# Patient Record
Sex: Female | Born: 1935 | Race: White | Hispanic: No | Smoking: Never smoker
Health system: Southern US, Community
[De-identification: ages and names within clinical notes are randomized; demographics above are authoritative.]

## PROBLEM LIST (undated history)

## (undated) DIAGNOSIS — I872 Venous insufficiency (chronic) (peripheral): Secondary | ICD-10-CM

## (undated) DIAGNOSIS — M810 Age-related osteoporosis without current pathological fracture: Secondary | ICD-10-CM

## (undated) DIAGNOSIS — I10 Essential (primary) hypertension: Secondary | ICD-10-CM

## (undated) DIAGNOSIS — I4891 Unspecified atrial fibrillation: Secondary | ICD-10-CM

## (undated) DIAGNOSIS — Z853 Personal history of malignant neoplasm of breast: Secondary | ICD-10-CM

## (undated) HISTORY — DX: Age-related osteoporosis without current pathological fracture: M81.0

## (undated) HISTORY — DX: Venous insufficiency (chronic) (peripheral): I87.2

## (undated) HISTORY — DX: Personal history of malignant neoplasm of breast: Z85.3

## (undated) HISTORY — PX: CATARACT EXTRACTION: SUR2

## (undated) HISTORY — DX: Essential (primary) hypertension: I10

## (undated) HISTORY — DX: Unspecified atrial fibrillation: I48.91

---

## 1980-08-24 HISTORY — PX: BREAST BIOPSY: SHX20

## 1992-08-24 HISTORY — PX: UMBILICAL HERNIA REPAIR: SHX196

## 1996-08-24 HISTORY — PX: BREAST BIOPSY: SHX20

## 2006-08-19 ENCOUNTER — Other Ambulatory Visit: Payer: Self-pay

## 2007-08-25 HISTORY — PX: COLONOSCOPY: SHX174

## 2008-08-24 DIAGNOSIS — Z853 Personal history of malignant neoplasm of breast: Secondary | ICD-10-CM

## 2008-08-24 HISTORY — DX: Personal history of malignant neoplasm of breast: Z85.3

## 2009-09-24 ENCOUNTER — Other Ambulatory Visit: Payer: Self-pay | Admitting: Internal Medicine

## 2009-09-24 ENCOUNTER — Encounter: Payer: Self-pay | Admitting: Cardiovascular Disease

## 2009-09-24 HISTORY — PX: BREAST BIOPSY: SHX20

## 2009-09-26 ENCOUNTER — Ambulatory Visit: Payer: Self-pay | Admitting: Cardiovascular Disease

## 2009-09-26 DIAGNOSIS — R600 Localized edema: Secondary | ICD-10-CM | POA: Insufficient documentation

## 2009-09-26 DIAGNOSIS — I10 Essential (primary) hypertension: Secondary | ICD-10-CM

## 2009-09-26 DIAGNOSIS — I4891 Unspecified atrial fibrillation: Secondary | ICD-10-CM | POA: Insufficient documentation

## 2009-09-30 ENCOUNTER — Telehealth: Payer: Self-pay | Admitting: Cardiovascular Disease

## 2009-10-09 ENCOUNTER — Telehealth: Payer: Self-pay | Admitting: Cardiovascular Disease

## 2009-10-18 ENCOUNTER — Encounter: Payer: Self-pay | Admitting: Cardiovascular Disease

## 2009-11-01 ENCOUNTER — Encounter: Payer: Self-pay | Admitting: Cardiovascular Disease

## 2009-11-05 ENCOUNTER — Telehealth: Payer: Self-pay | Admitting: Cardiovascular Disease

## 2009-11-06 ENCOUNTER — Encounter: Payer: Self-pay | Admitting: Cardiology

## 2009-11-06 LAB — CONVERTED CEMR LAB: INR: 2.5

## 2009-11-26 ENCOUNTER — Encounter: Payer: Self-pay | Admitting: Cardiovascular Disease

## 2009-12-06 ENCOUNTER — Telehealth: Payer: Self-pay | Admitting: Cardiovascular Disease

## 2009-12-24 ENCOUNTER — Encounter: Payer: Self-pay | Admitting: Cardiovascular Disease

## 2009-12-25 ENCOUNTER — Encounter: Payer: Self-pay | Admitting: Cardiovascular Disease

## 2010-01-06 ENCOUNTER — Encounter: Payer: Self-pay | Admitting: Cardiovascular Disease

## 2010-01-06 ENCOUNTER — Ambulatory Visit: Payer: Self-pay | Admitting: Cardiovascular Disease

## 2010-01-21 ENCOUNTER — Encounter: Payer: Self-pay | Admitting: Cardiovascular Disease

## 2010-01-22 ENCOUNTER — Encounter: Payer: Self-pay | Admitting: Cardiovascular Disease

## 2010-02-03 ENCOUNTER — Encounter: Payer: Self-pay | Admitting: Cardiovascular Disease

## 2010-02-04 ENCOUNTER — Encounter: Payer: Self-pay | Admitting: Cardiovascular Disease

## 2010-02-13 ENCOUNTER — Encounter: Payer: Self-pay | Admitting: Cardiovascular Disease

## 2010-02-17 ENCOUNTER — Encounter: Payer: Self-pay | Admitting: Cardiovascular Disease

## 2010-03-03 ENCOUNTER — Encounter: Payer: Self-pay | Admitting: Cardiovascular Disease

## 2010-03-04 ENCOUNTER — Encounter: Payer: Self-pay | Admitting: Cardiovascular Disease

## 2010-03-18 ENCOUNTER — Encounter: Payer: Self-pay | Admitting: Cardiovascular Disease

## 2010-03-19 ENCOUNTER — Encounter: Payer: Self-pay | Admitting: Cardiovascular Disease

## 2010-04-09 ENCOUNTER — Telehealth: Payer: Self-pay | Admitting: Cardiovascular Disease

## 2010-04-15 ENCOUNTER — Encounter: Payer: Self-pay | Admitting: Cardiovascular Disease

## 2010-04-15 LAB — CONVERTED CEMR LAB: Prothrombin Time: 18.1 s

## 2010-04-17 ENCOUNTER — Encounter: Payer: Self-pay | Admitting: Cardiovascular Disease

## 2010-05-06 ENCOUNTER — Encounter: Payer: Self-pay | Admitting: Cardiovascular Disease

## 2010-05-12 ENCOUNTER — Telehealth: Payer: Self-pay | Admitting: Cardiovascular Disease

## 2010-05-12 ENCOUNTER — Encounter: Payer: Self-pay | Admitting: Cardiovascular Disease

## 2010-05-21 ENCOUNTER — Telehealth: Payer: Self-pay | Admitting: Cardiovascular Disease

## 2010-06-16 ENCOUNTER — Telehealth: Payer: Self-pay | Admitting: Cardiovascular Disease

## 2010-07-11 ENCOUNTER — Ambulatory Visit: Payer: Self-pay | Admitting: Cardiovascular Disease

## 2010-08-13 ENCOUNTER — Encounter: Payer: Self-pay | Admitting: Cardiovascular Disease

## 2010-09-23 NOTE — Progress Notes (Signed)
Summary: PHI  PHI   Imported By: Harlon Flor 09/27/2009 12:25:52  _____________________________________________________________________  External Attachment:    Type:   Image     Comment:   External Document

## 2010-09-23 NOTE — Progress Notes (Signed)
Summary: RX  Phone Note Refill Request Call back at Home Phone [phone] Message from:  Patient on June 16, 2010 11:11 AM  Refills Requested: Medication #1:  METOPROLOL TARTRATE 50 MG TABS Take 1 and 1/2 tablets by mouth twice a day   Notes: WOULD LIKE A 90 DAY SUPPLY [facility]  Initial call taken by: Harlon Flor,  June 16, 2010 11:11 AM    Prescriptions: METOPROLOL TARTRATE 50 MG TABS (METOPROLOL TARTRATE) Take 1 and 1/2 tablets by mouth twice a day  #135 x 3   Entered by:   Bishop Dublin, CMA   Authorized by:   Dossie Arbour MD   Signed by:   Bishop Dublin, CMA on 06/16/2010   Method used:   Faxed to ...       [facility] (mail-order)             , [place]         Ph: [phone]       Fax: [phone]   RxID:   [id number]

## 2010-09-23 NOTE — Assessment & Plan Note (Signed)
Summary: F6M/AMD   Visit Type:  Follow-up Primary Provider:  Dr. Burnett Sheng  CC:  c/o edema in right leg and ankles..  History of Present Illness: [name] is a very pleasant 77 year old woman with a history of chronic atrial fibrillation, on Coumadin with rate control, negative stress test in August 2009 with no known coronary artery disease who presents for routine followup,   H/o right mastectomy for breast cancer. Following the surgery, she had atrial fibrillation with RVR. She was given a dose of Cardizem and her rate improved.  she continues to have trace lower extremity edema though it has been relatively stable. She denies any shortness of breath. She has been active, she continues to drive. She has been getting her condo that he at [address] [place] to accommodate her husband who is recovering from a stroke.  her weight has been slowly trending downward. She eats a significant amount of despite this she is had difficulty maintaining weight. She also has arthritis that bothers her particularly in the morning.  EKG shows atrial fibrillation with ventricular rate 86 beats per minute, no significant ST or T wave changes  Current Medications (verified): 1)  Hydrochlorothiazide 25 Mg Tabs (Hydrochlorothiazide) .Marland Kitchen.. 1 Tab By Mouth Every Morning 2)  Coumadin 2.5 Mg Tabs (Warfarin Sodium) .... As Directed 3)  Aspirin 81 Mg Tbec (Aspirin) .... Take One Tablet By Mouth Daily 4)  Metoprolol Tartrate 50 Mg Tabs (Metoprolol Tartrate) .... Take 1 and 1/2 Tablets By Mouth Twice A Day 5)  Warfarin Sodium 3 Mg Tabs (Warfarin Sodium) .... Take As Directed 6)  Femara 2.5 Mg Tabs (Letrozole) .... Take 1 Tablet By Mouth Once A Day 7)  Furosemide 20 Mg Tabs (Furosemide) .... One Tablet Once Daily 8)  Meloxicam 15 Mg Tabs (Meloxicam) .... One Tablet Once Daily For 4 Weeks.  Allergies (verified): No Known Drug Allergies  Past History:  Past Medical History: Last updated: 25-Oct-2009 Atrial  Fibrillation  Past Surgical History: Last updated: 10-25-2009 Breast surgery on right side 09/24/09 Cataract surgery 2008 and 2009  Family History: Last updated: 10-25-09 Father: Died at 4 years old of cerebral hemarrhage. Mother: Died at 75 years old of old age.  Social History: Last updated: October 25, 2009 Retired  Married  Tobacco Use - No.  Alcohol Use - yes Regular Exercise - yes Drug Use - no  Risk Factors: Alcohol Use: 1 (10-25-09) Caffeine Use: 1 cup (10-25-2009) Exercise: yes (October 25, 2009)  Risk Factors: Smoking Status: never (10/25/09)  Review of Systems       The patient complains of peripheral edema and difficulty walking.  The patient denies fever, weight loss, weight gain, vision loss, decreased hearing, hoarseness, chest pain, syncope, dyspnea on exertion, prolonged cough, abdominal pain, incontinence, muscle weakness, depression, and enlarged lymph nodes.    Vital Signs:  Patient profile:   77 year old female Height:      68 inches Weight:      122.25 pounds BMI:     18.66 Pulse rate:   86 / minute BP sitting:   150 / 90  (left arm) Cuff size:   small  Vitals Entered By: Bishop Dublin, CMA (July 11, 2010 11:13 AM)  Physical Exam  General:  elderly woman in no apparent distress, HEENT exam is benign, or Chalmers Guest is clear, neck is supple with no JVP or carotid bruits, heart sounds are irregular with S1-S2 no murmurs appreciated, lungs are clear to auscultation with no wheezes rales, abdominal exam is benign, she has trace  lower extremity edema around her ankles bilaterally, signs of chronic venous stasis skin changes up the shins, pulses are equal and symmetrical in her upper and lower extremities.   Impression & Recommendations:  Problem # 1:  ATRIAL FIBRILLATION (ICD-427.31) rate is stable on her current medication regimen. We've made no changes..  Her updated medication list for this problem includes:    Coumadin 2.5 Mg Tabs (Warfarin sodium)  .Marland Kitchen... As directed    Aspirin 81 Mg Tbec (Aspirin) .Marland Kitchen... Take one tablet by mouth daily    Metoprolol Tartrate 50 Mg Tabs (Metoprolol tartrate) .Marland Kitchen... Take 1 and 1/2 tablets by mouth twice a day    Warfarin Sodium 3 Mg Tabs (Warfarin sodium) .Marland Kitchen... Take as directed  Orders: EKG w/ Interpretation (93000)  Problem # 2:  HYPERTENSION, BENIGN (ICD-401.1) Blood pressure is borderline elevated today though she reports is well controlled at home. We've made no medication changes and we'll continue to monitor her blood pressure.  Her updated medication list for this problem includes:    Hydrochlorothiazide 25 Mg Tabs (Hydrochlorothiazide) .Marland Kitchen... 1 tab by mouth every morning    Aspirin 81 Mg Tbec (Aspirin) .Marland Kitchen... Take one tablet by mouth daily    Metoprolol Tartrate 50 Mg Tabs (Metoprolol tartrate) .Marland Kitchen... Take 1 and 1/2 tablets by mouth twice a day    Furosemide 20 Mg Tabs (Furosemide) ..... One tablet once daily  Problem # 3:  EDEMA (ICD-782.3) Her edema is likely secondary to chronic venous stasis. I've encouraged TED hose and leg elevation.  Patient Instructions: 1)  Your physician recommends that you schedule a follow-up appointment in: 6 months. 2)  Your physician recommends that you continue on your current medications as directed. Please refer to the Current Medication list given to you today.

## 2010-09-23 NOTE — Progress Notes (Signed)
Summary: RESULTS  Call back at Home Phone [phone]   Caller: SELF Call For: [facility] Summary of Call: PT WOULD LIKE HER PT RESULTS FROM LAST WEEK Initial call taken by: Harlon Flor,  November 05, 2009 4:43 PM     Appended Document: RESULTS Labs sent to [facility].  Office contacted to have labs faxed to our office.  Will contact pt when those results are rec'd. Charlena Cross RN BSN   Appended Document: RESULTS results rec'd and pt aware. Charlena Cross RN BSN

## 2010-09-23 NOTE — Progress Notes (Signed)
Summary: BP readings  Phone Note Call from Patient   Summary of Call: BP: 129/71- 79, 127/75 - 63, 112/72-65, 115/64-67, 108/60-73, 106/70-86,   Initial call taken by: Charlena Cross, RN, BSN,  October 09, 2009 3:42 PM  Follow-up for Phone Call        Spectacular!!! really!!

## 2010-09-23 NOTE — Progress Notes (Signed)
Summary: Coumadin  Phone Note Outgoing Call   Call placed by: Benedict Needy, RN,  April 09, 2010 9:53 AM Call placed to: Patient Summary of Call: Recieved a request from [facility] to renew PT orders.  Called pt to inform her of the policy change at [facility].  Pt must come in to office and have finger stick PT/INR. No answer after 10+ rings.  Initial call taken by: Benedict Needy, RN,  April 09, 2010 9:54 AM  Follow-up for Phone Call        pt is aware had blood drawn at [facility] today.  is aware that after today we will no longer be sending orders to [facility] for her PT/INR's.  Follow-up by: Benedict Needy, RN,  April 15, 2010 4:08 PM

## 2010-09-23 NOTE — Medication Information (Signed)
Summary: Coumadin Clinic  Coumadin Clinic   Imported By: West Carbo 03/04/2010 07:50:03  _____________________________________________________________________  External Attachment:    Type:   Image     Comment:   External Document  Appended Document: Coumadin Clinic    Anticoagulant Therapy  Managed by: Cloyde Reams, RN, BSN Referring MD: Mariah Milling PCP: Dr. Moses Manners MD: Mariah Milling Indication 1: Atrial Fibrillation Lab Used: [facility]  Site: [place] [facility] 22.1 INR POC 2.1 INR RANGE 2.0-3.0  Dietary changes: no     Bleeding/hemorrhagic complications: no       Any missed doses?: no       Is patient compliant with meds? yes       Allergies: No Known Drug Allergies  Anticoagulation Management History:      Her anticoagulation is being managed by telephone today.  Positive risk factors for bleeding include an age of 75 years or older.  The bleeding index is 'intermediate risk'.  Positive CHADS2 values include History of HTN and Age > 37 years old.  Her last INR was 2.2.  Prothrombin time is 22.1.  Anticoagulation responsible provider: Cheynne Virden.  INR POC: 2.1.  Exp: 03/2011.    Anticoagulation Management Assessment/Plan:      The patient's current anticoagulation dose is Coumadin 2.5 mg tabs: as directed.  The target INR is 2.0-3.0.  The next INR is due 03/25/2010.  Anticoagulation instructions were given to patient.  Results were reviewed/authorized by Cloyde Reams, RN, BSN.  She was notified by Benedict Needy, RN.         Prior Anticoagulation Instructions: INR 1.9  Called spoke with pt advised to start taking 3mg  daily except 4.5mg  on Mondays, Wednesdays, and Saturdays.  Recheck in 2 weeks.    Current Anticoagulation Instructions: INR 2.1  Called and spoke to pt advises to continue same dose of 1 tab daily except for 1.5 tabs on Monday, Wednesday and Saturday.  Recheck in 3 weeks.

## 2010-09-23 NOTE — Medication Information (Signed)
Summary: Coumadin Clinic  Anticoagulant Therapy  Managed by: Charlena Cross, RN, BSN Referring MD: Mariah Milling PCP: Dr. Moses Manners MD: Mariah Milling Indication 1: Atrial Fibrillation Lab Used: [facility] [place] Site: [place] PT 23.2 INR RANGE 2.0-3.0  Dietary changes: no    Health status changes: no    Bleeding/hemorrhagic complications: no    Recent/future hospitalizations: no    Any changes in medication regimen? no    Recent/future dental: no  Any missed doses?: no       Is patient compliant with meds? yes       Allergies: No Known Drug Allergies  Anticoagulation Management History:      Her anticoagulation is being managed by telephone today.  Positive risk factors for bleeding include an age of 75 years or older.  The bleeding index is 'intermediate risk'.  Positive CHADS2 values include History of HTN and Age > 24 years old.  Her last INR was 2.5 and today's INR is 2.2.  Prothrombin time is 23.2.  Anticoagulation responsible provider: Effrey Davidow.    Anticoagulation Management Assessment/Plan:      The patient's current anticoagulation dose is Coumadin 2.5 mg tabs: as directed.  The target INR is 2.0-3.0.  The next INR is due 12/24/2009.  Anticoagulation instructions were given to patient.  Results were reviewed/authorized by Charlena Cross, RN, BSN.  She was notified by Charlena Cross, RN, BSN.         Prior Anticoagulation Instructions: The patient is to continue with the same dose of coumadin.  This dosage includes: 2.5 mg daily with 3 mg on T Th Sa

## 2010-09-23 NOTE — Progress Notes (Signed)
Summary: CALL BACK  Phone Note Call from Patient Call back at Home Phone [phone]   Caller: SELF Call For: [facility] Summary of Call: HUSBAND WOULD LIKE A CALL BACK ABOUT BP READINGS Initial call taken by: Harlon Flor,  September 30, 2009 2:30 PM  Follow-up for Phone Call        pts husband does not want to have [place] [facility] to take daily BP's.  Pt's son brought over his BP cuff and they would like to use this instead.  Instructed pts husband that he would need to take cuff to [facility] to have cudd calibrated and then he could continue to use cuff daily for 2 weeks.  he will mail results to [place]. Follow-up by: Charlena Cross, RN, BSN,  September 30, 2009 2:54 PM

## 2010-09-23 NOTE — Medication Information (Signed)
Summary: Coumadin Clinic  Anticoagulant Therapy  Managed by: Cloyde Reams, RN, BSN Referring MD: Mariah Milling PCP: Dr. Moses Manners MD: Mariah Milling Indication 1: Atrial Fibrillation Lab Used: [facility] [place] Site: [place] INR POC 1.6 INR RANGE 2.0-3.0  Dietary changes: no    Health status changes: no    Bleeding/hemorrhagic complications: no    Recent/future hospitalizations: no    Any changes in medication regimen? no    Recent/future dental: no  Any missed doses?: no       Is patient compliant with meds? yes       Allergies: No Known Drug Allergies  Anticoagulation Management History:      The patient is taking warfarin and comes in today for a routine follow up visit.  Positive risk factors for bleeding include an age of 45 years or older.  The bleeding index is 'intermediate risk'.  Positive CHADS2 values include History of HTN and Age > 26 years old.  Her last INR was 2.2.  Anticoagulation responsible provider: Syriah Delisi.  INR POC: 1.6.  Cuvette Lot#: [id number].  Exp: 03/2011.    Anticoagulation Management Assessment/Plan:      The patient's current anticoagulation dose is Coumadin 2.5 mg tabs: as directed.  The target INR is 2.0-3.0.  The next INR is due 01/20/2010.  Anticoagulation instructions were given to patient.  Results were reviewed/authorized by Cloyde Reams, RN, BSN.  She was notified by Cloyde Reams RN.         Prior Anticoagulation Instructions: INR 1.5  Called spoke with pt.  Advised to take 3mg  today and tomorrow then resume same dosage 2.5mg  daily except 3mg  on Tu,Th,Sa.  Recheck in 2 weeks.     Current Anticoagulation Instructions: INR 1.6  Start taking 3mg  daily except 2.5mg  on Mondays and Fridays.  Recheck in 2 weeks.

## 2010-09-23 NOTE — Medication Information (Signed)
Summary: Coumadin Clinic  Coumadin Clinic   Imported By: West Carbo 03/19/2010 07:33:25  _____________________________________________________________________  External Attachment:    Type:   Image     Comment:   External Document  Appended Document: Coumadin Clinic    Anticoagulant Therapy  Managed by: Cloyde Reams, RN, BSN Referring MD: Mariah Milling PCP: Dr. Moses Manners MD: Mariah Milling Indication 1: Atrial Fibrillation Lab Used: [facility] [place] Site: [place] [facility] 24.2 INR POC 2.3 INR RANGE 2.0-3.0    Bleeding/hemorrhagic complications: no     Any changes in medication regimen? no     Any missed doses?: no       Is patient compliant with meds? yes       Allergies: No Known Drug Allergies  Anticoagulation Management History:      Her anticoagulation is being managed by telephone today.  Positive risk factors for bleeding include an age of 20 years or older.  The bleeding index is 'intermediate risk'.  Positive CHADS2 values include History of HTN and Age > 36 years old.  Her last INR was 2.2.  Prothrombin time is 24.2.  Anticoagulation responsible provider: Caresse Sedivy.  INR POC: 2.3.  Exp: 03/2011.    Anticoagulation Management Assessment/Plan:      The patient's current anticoagulation dose is Coumadin 2.5 mg tabs: as directed.  The target INR is 2.0-3.0.  The next INR is due 04/15/2010.  Anticoagulation instructions were given to patient.  Results were reviewed/authorized by Cloyde Reams, RN, BSN.  She was notified by Cloyde Reams RN.         Prior Anticoagulation Instructions: INR 2.1  Called and spoke to pt advises to continue same dose of 1 tab daily except for 1.5 tabs on Monday, Wednesday and Saturday.  Recheck in 3 weeks.  Current Anticoagulation Instructions: INR 2.3  Called spoke with pt, advised to continue on same dosage 1 tablet daily except 1.5 tablets on Mondays, Wednesdays, and Saturdays.  Recheck in 4 weeks.

## 2010-09-23 NOTE — Medication Information (Signed)
Summary: Coumadin Clinic  Anticoagulant Therapy  Managed by: Cloyde Reams, RN, BSN Referring MD: Mariah Milling PCP: Dr. Moses Manners MD: Mariah Milling Indication 1: Atrial Fibrillation Lab Used: [facility] [place] Site: [place] PT 18.1 INR POC 1.7 INR RANGE 2.0-3.0    Bleeding/hemorrhagic complications: no     Any changes in medication regimen? yes       Details: Started on Femera.   Any missed doses?: no       Is patient compliant with meds? yes       Allergies: No Known Drug Allergies   Anticoagulation Management History:      Her anticoagulation is being managed by telephone today.  Positive risk factors for bleeding include an age of 75 years or older.  The bleeding index is 'intermediate risk'.  Positive CHADS2 values include History of HTN and Age > 75 years old.  Her last INR was 2.2.  Prothrombin time is 18.1.  Anticoagulation responsible provider: Eyana Stolze.  INR POC: 1.7.  Exp: 03/2011.    Anticoagulation Management Assessment/Plan:      The patient's current anticoagulation dose is Coumadin 2.5 mg tabs: as directed, Warfarin sodium 3 mg tabs: take as directed.  The target INR is 2.0-3.0.  The next INR is due 05/07/2010.  Anticoagulation instructions were given to patient.  Results were reviewed/authorized by Cloyde Reams, RN, BSN.  She was notified by Cloyde Reams RN.         Prior Anticoagulation Instructions: INR 2.3  Called spoke with pt, advised to continue on same dosage 1 tablet daily except 1.5 tablets on Mondays, Wednesdays, and Saturdays.  Recheck in 4 weeks.    Current Anticoagulation Instructions: INR 1.7  Attempted to call pt with results.  NA and answering machine is off. Cloyde Reams RN  April 17, 2010 11:13 AM  Called spoke with pt advised to start taking 1.5 tablets daily except 1 tablet on Sundays, Tuesdays, and Thursdays.  Recheck in 3 weeks.  Advised pt recheck must be in office, no longer allowed to have drawn at [facility].  Pt declined to  schedule f/u appt, but is aware must have done in [place] office POC. Erika Johnson RN  April 18, 2010 9:54 AM  Prescriptions: METOPROLOL TARTRATE 50 MG TABS (METOPROLOL TARTRATE) Take 1 and 1/2 tablets by mouth twice a day  #90 x 1   Entered by:   Erika Johnson RN   Authorized by:   Tim Kervens Roper MD   Signed by:   Erika Johnson RN on 04/18/2010   Method used:   Electronically to        [facility] [id number]* (retail)       [address] [place], [place]  [id number]       Ph: [phone]       Fax: [phone]   RxID:   [id number]

## 2010-09-23 NOTE — Assessment & Plan Note (Signed)
Summary: F3M/AMD   Primary Provider:  Dr. Burnett Sheng  CC:  ROV;No complaints.  History of Present Illness: [name] is a very pleasant 77 year old woman with a history of chronic atrial fibrillation, on Coumadin with rate control, negative stress test in August 2009 with no known coronary artery disease who presents for routine followup.  Ms. [name] recently had right mastectomy for breast cancer. Following the surgery, she had atrial fibrillation with RVR. She was given a dose of Cardizem and her rate improved and she was discharged home. Since then she has felt well without problems.    She has had worsening lower extremity edema over the past several months of uncertain etiology. It is discolored, has dry skin and goes halfway up her shins bilaterally. She denies any significant shortness of breath chest discomfort.  Today her edema has significantly improved. It is better she has her legs elevated, and better in the morning.   She has been helping to take care of her husband who had a recent stroke. He is in recovery. ;  Problems Prior to Update: 1)  Atrial Fibrillation  (ICD-427.31) 2)  Hypertension, Benign  (ICD-401.1) 3)  Edema  (ICD-782.3)  Medications Prior to Update: 1)  Hydrochlorothiazide 25 Mg Tabs (Hydrochlorothiazide) .Marland Kitchen.. 1 Tab By Mouth Every Morning 2)  Coumadin 2.5 Mg Tabs (Warfarin Sodium) .... As Directed 3)  Aspirin 81 Mg Tbec (Aspirin) .... Take One Tablet By Mouth Daily 4)  Metoprolol Tartrate 50 Mg Tabs (Metoprolol Tartrate) .... Take 1 and 1/2 Tablets By Mouth Twice A Day  Current Medications (verified): 1)  Hydrochlorothiazide 25 Mg Tabs (Hydrochlorothiazide) .Marland Kitchen.. 1 Tab By Mouth Every Morning 2)  Coumadin 2.5 Mg Tabs (Warfarin Sodium) .... As Directed 3)  Aspirin 81 Mg Tbec (Aspirin) .... Take One Tablet By Mouth Daily 4)  Metoprolol Tartrate 50 Mg Tabs (Metoprolol Tartrate) .... Take 1 and 1/2 Tablets By Mouth Twice A Day  Allergies (verified): No  Known Drug Allergies  Past History:  Past Medical History: Last updated: 10-06-09 Atrial Fibrillation  Past Surgical History: Last updated: 10-06-09 Breast surgery on right side 09/24/09 Cataract surgery 2008 and 2009  Family History: Last updated: Oct 06, 2009 Father: Died at 84 years old of cerebral hemarrhage. Mother: Died at 32 years old of old age.  Social History: Last updated: 10-06-2009 Retired  Married  Tobacco Use - No.  Alcohol Use - yes Regular Exercise - yes Drug Use - no  Risk Factors: Alcohol Use: 1 (06-Oct-2009) Caffeine Use: 1 cup (Oct 06, 2009) Exercise: yes (2009/10/06)  Risk Factors: Smoking Status: never (10/06/09)  Review of Systems       The patient complains of peripheral edema.  The patient denies fever, weight loss, weight gain, vision loss, decreased hearing, hoarseness, chest pain, syncope, dyspnea on exertion, prolonged cough, abdominal pain, incontinence, muscle weakness, depression, and enlarged lymph nodes.    Vital Signs:  Patient profile:   77 year old female Height:      68 inches Weight:      124 pounds BMI:     18.92 Pulse rate:   72 / minute BP sitting:   112 / 80  (left arm) Cuff size:   regular  Vitals Entered By: Stanton Kidney, EMT-P (Jan 06, 2010 10:36 AM)  Physical Exam  General:  elderly woman in no apparent distress, HEENT exam is benign, or Chalmers Guest is clear, neck is supple with no JVP or carotid bruits, heart sounds are irregular with S1-S2 no murmurs appreciated, lungs are clear  to auscultation with no wheezes rales, abdominal exam is benign, she has trace to 1+ lower extremity edema around her ankles bilaterally, signs of chronic venous stasis skin changes up the shins, pulses are equal and symmetrical in her upper and lower extremities.    EKG  Procedure date:  09/24/2009  Findings:      H. fibrillation with rate 86 beats per minute, rare PVC, no significant ST or T wave changes.  Impression &  Recommendations:  Problem # 1:  ATRIAL FIBRILLATION (ICD-427.31) rate is well-controlled on her current medication regimen of metoprolol. We'll keep her on the same dose and the warfarin.  Her updated medication list for this problem includes:    Coumadin 2.5 Mg Tabs (Warfarin sodium) .Marland Kitchen... As directed    Aspirin 81 Mg Tbec (Aspirin) .Marland Kitchen... Take one tablet by mouth daily    Metoprolol Tartrate 50 Mg Tabs (Metoprolol tartrate) .Marland Kitchen... Take 1 and 1/2 tablets by mouth twice a day  Problem # 2:  HYPERTENSION, BENIGN (ICD-401.1) Blood pressure is well controlled on today's visit and we've made no medication changes.  Her updated medication list for this problem includes:    Hydrochlorothiazide 25 Mg Tabs (Hydrochlorothiazide) .Marland Kitchen... 1 tab by mouth every morning    Aspirin 81 Mg Tbec (Aspirin) .Marland Kitchen... Take one tablet by mouth daily    Metoprolol Tartrate 50 Mg Tabs (Metoprolol tartrate) .Marland Kitchen... Take 1 and 1/2 tablets by mouth twice a day  Problem # 3:  EDEMA (ICD-782.3) Her edema has improved with HCTZ 25 mg daily. It is difficult to determine if this is  due to elevated right ventricular systolic pressures and diastolic dysfunction or chronic venous insufficiency. Her symptoms are mild at this time small amount of swelling around her ankles. She does not seem particularly bothered by it.  Patient Instructions: 1)  Your physician recommends that you schedule a follow-up appointment in: 6 months.

## 2010-09-23 NOTE — Progress Notes (Signed)
Summary: BP  Phone Note Call from Patient Call back at Home Phone [phone]   Caller: SELF Call For: [facility] Summary of Call: WANTED TO LET GOLLAN KNOW THAT HER BP IS DOING WELL Initial call taken by: Harlon Flor,  December 06, 2009 8:22 AM  Follow-up for Phone Call        noted. Charlena Cross RN BSN

## 2010-09-23 NOTE — Progress Notes (Signed)
Summary: coumadin  Phone Note Call from Patient Call back at Home Phone [phone]   Caller: self Call For: gollan Summary of Call: Pt is calling for her PT INR results from [facility].  I explained to her that we did not send her to [facility] for the labs and that we would not be able to obtain the results, as we do not have an order for them.  I explained to her that we are no longer allowed to send patients to [facility] for PT INR lab draws anymore and that if she wanted [place] to maintain her coumadin that she would need to get her finger stick done here.  She stated that it was not convenient for her to do that and this was the first time she had been told that.  I stated to her that I had explained this to her once before, that the office manager had explained this to her, and that the nurse had explained this to her several times.  She stated that Dr. Mariah Milling had not explained this to her.  She stated that she would not come here for her coumadin and that she "would just die".  She, then, hung up the phone on me. Initial call taken by: Harlon Flor,  May 12, 2010 12:27 PM  Follow-up for Phone Call        Pt called again said that she spoke with Angelica Chessman at [facility] and Dr. Webb Silversmith office that neither of them do the finger stick for coumadin. Tried to explain to pt that for [facility] to follow her coumadin  that she would need to come to office and be seen by coumadin nurse.  Called and spoke to [place] at [facility] explained to her how 's policiy for coumadin has changed and that the pt's need to come in to the office for be more closely monitored. She agreed.  Called and left a message with Dr. Webb Silversmith nurse to see if there office uses [facility] for coumadin draws and if they would take over her coumadin, have not heard back from there office at this time. Benedict Needy, RN  May 12, 2010 3:36 PM   Spoke to [place] at Dr. Webb Silversmith office she will discuss taking over pt  coumadin monitoring and call the office back. Benedict Needy, RN  May 13, 2010 11:04 AM    Additional Follow-up for Phone Call Additional follow up Details #1::        Spoke to [facility] from Dr. Webb Silversmith office they will be taking over her coumadin. Coumadin notes faxed and pt called and is aware. Benedict Needy, RN  May 13, 2010 4:35 PM

## 2010-09-23 NOTE — Assessment & Plan Note (Signed)
Summary: NP6   Visit Type:  New patient Primary Provider:  Dr. Burnett Sheng  CC:  Swelling in ankles.  History of Present Illness: [name] is a very pleasant 77 year old woman with a history of chronic atrophic fibrillation, on Coumadin with rate control, negative stress test in August 2009 with no known coronary artery disease who presents for routine followup.  [name] recently had right mastectomy for breast cancer. Following the surgery, she had atrial fibrillation with RVR. She was given a dose of Cardizem and her rate improved and she was discharged home. Since then she has felt well without problems. She continues on her diltiazem and HCTZ and is due to restart her Coumadin tonight. She has had worsening lower extremity edema over the past several months of uncertain etiology. It is discolored, has dry skin and goes halfway up her shins bilaterally. She denies any significant shortness of breath chest discomfort.  Preventive Screening-Counseling & Management  Alcohol-Tobacco     Alcohol drinks/day: 1     Alcohol type: wine     Smoking Status: never  Caffeine-Diet-Exercise     Caffeine use/day: 1 cup     Does Patient Exercise: yes     Type of exercise: walking      Drug Use:  no.    Current Medications (verified): 1)  Hydrochlorothiazide 25 Mg Tabs (Hydrochlorothiazide) .Marland Kitchen.. 1 Tab By Mouth Every Morning 2)  Coumadin 2.5 Mg Tabs (Warfarin Sodium) .... As Directed 3)  Diltiazem Hcl Er Beads 240 Mg Xr24h-Cap (Diltiazem Hcl Er Beads) .... Take One Capsule By Mouth Daily At Bedtime 4)  Aspirin 81 Mg Tbec (Aspirin) .... Take One Tablet By Mouth Daily  Allergies (verified): No Known Drug Allergies  Past History:  Family History: Last updated: Oct 21, 2009 Father: Died at 83 years old of cerebral hemarrhage. Mother: Died at 60 years old of old age.  Social History: Last updated: 10-21-09 Retired  Married  Tobacco Use - No.  Alcohol Use - yes Regular Exercise -  yes Drug Use - no  Risk Factors: Alcohol Use: 1 (10-21-09) Caffeine Use: 1 cup (10/21/2009) Exercise: yes (October 21, 2009)  Risk Factors: Smoking Status: never (21-Oct-2009)  Past Medical History: Atrial Fibrillation  Past Surgical History: Breast surgery on right side 09/24/09 Cataract surgery 2008 and 2009  Family History: Father: Died at 73 years old of cerebral hemarrhage. Mother: Died at 44 years old of old age.  Social History: Retired  Married  Tobacco Use - No.  Alcohol Use - yes Regular Exercise - yes Drug Use - no Smoking Status:  never Alcohol drinks/day:  1 Caffeine use/day:  1 cup Does Patient Exercise:  yes Drug Use:  no  Review of Systems       The patient complains of peripheral edema.  The patient denies anorexia, fever, weight loss, weight gain, vision loss, decreased hearing, hoarseness, chest pain, syncope, dyspnea on exertion, prolonged cough, headaches, hemoptysis, abdominal pain, melena, hematochezia, severe indigestion/heartburn, hematuria, incontinence, genital sores, muscle weakness, suspicious skin lesions, transient blindness, difficulty walking, depression, unusual weight change, abnormal bleeding, enlarged lymph nodes, angioedema, breast masses, and testicular masses.         Edema with discoloration to mid shin  Vital Signs:  Patient profile:   77 year old female Height:      68 inches Weight:      132.75 pounds Pulse (ortho):   78 / minute Pulse rhythm:   regular BP sitting:   142 / 70  (left arm) Cuff size:  regular  Vitals Entered By: Mercer Pod (September 26, 2009 3:51 PM)  Physical Exam  General:  well-appearing woman with a bandage wrapped around her chest. No apparent distress. Appears comfortable with a respiratory compromise. HEENT exam is benign, oropharynx is clear, neck is supple with no JVP or carotid bruits heart sounds are irregular with S1-S2 and no murmurs appreciated. Lungs are clear to auscultation with no  wheezes Rales. Bandage around her chest is in place and extends into the right axilla appeared dull exam is benign nontender. Nose neurologic findings. She does have trace chronic woody edema to the mid shins bilaterally with discoloration and mild blistering and dry skin noted. Pulses are equal and symmetrical in her upper and lower extremities.    Impression & Recommendations:  Problem # 1:  ATRIAL FIBRILLATION (ICD-427.31) H. fibrillation rate is well controlled on today's visit. I suspect the elevated rate she had postoperatively was due to her recent mastectomy but seemed to improve with a dose of Cardizem. Currently she is relatively asymptomatic and maintains chronic H. fibrillation on Coumadin. She does have severe chronic lower extremity edema which I am uncertain as to the etiology. It is possible that it might be due to her diltiazem ER and I will change this to metoprolol 75 mg p.o. b.i.d. I've asked the nurse at [facility] to check her heart rate and her blood pressure. Her updated medication list for this problem includes:    Coumadin 2.5 Mg Tabs (Warfarin sodium) .Marland Kitchen... As directed    Aspirin 81 Mg Tbec (Aspirin) .Marland Kitchen... Take one tablet by mouth daily    Metoprolol Tartrate 50 Mg Tabs (Metoprolol tartrate) .Marland Kitchen... Take 1 and 1/2 tablets by mouth twice a day  Problem # 2:  HYPERTENSION, BENIGN (ICD-401.1) blood pressure is borderline elevated today. I am concerned that her blood pressure may climb with the change from the diltiazem to the metoprolol. I am reluctant to start by systolic instead of metoprolol given at sometimes less likely cause fluid retention and may make her lower extremity edema worse. Will try the Toprol b.i.d. for now with close monitoring of her blood pressure. The following medications were removed from the medication list:    Diltiazem Hcl Er Beads 240 Mg Xr24h-cap (Diltiazem hcl er beads) .Marland Kitchen... Take one capsule by mouth daily at bedtime Her updated medication list  for this problem includes:    Hydrochlorothiazide 25 Mg Tabs (Hydrochlorothiazide) .Marland Kitchen... 1 tab by mouth every morning    Aspirin 81 Mg Tbec (Aspirin) .Marland Kitchen... Take one tablet by mouth daily    Metoprolol Tartrate 50 Mg Tabs (Metoprolol tartrate) .Marland Kitchen... Take 1 and 1/2 tablets by mouth twice a day  Problem # 3:  EDEMA (ICD-782.3) etiology of her edema is uncertain. I suspect that it might be secondary to the calcium channel blocker which I had stopped on today's visit. I've encouraged her to use TED hose stockings though she states that she has a difficult time putting them on. I have encouraged her to leg elevation when at home. Clinical presentation is consistent with chronic venous stasis.  Patient Instructions: 1)  Your physician recommends that you schedule a follow-up appointment in: 3 months 2)  Your physician has recommended you make the following change in your medication: stop diltiazem, start metoprolol 75 mg twice a day Prescriptions: METOPROLOL TARTRATE 50 MG TABS (METOPROLOL TARTRATE) Take 1 and 1/2 tablets by mouth twice a day  #90 x 6   Entered by:   Charlena Cross,  RN, BSN   Authorized by:   Dossie Arbour MD   Signed by:   Charlena Cross, RN, BSN on 09/26/2009   Method used:   Electronically to        [facility]. [address] [phone]* (retail)       [address] [place], [place]  [id number]       Ph: [phone]       Fax: [phone]   RxID:   [id number]   Appended Document: NP6 EKG from September 24, 2009 done at [facility] shows atrial fibrillation with ventricular rate of 86 beats per minute, rare PVC, no significant ST or T wave changes.  Appended Document: Orders Update    Clinical Lists Changes  Orders: Added new Referral order of Misc. Referral (Misc. Ref) - Signed

## 2010-09-23 NOTE — Progress Notes (Signed)
Summary: Coumadin  Phone Note Outgoing Call   Call placed by: Benedict Needy, RN,  May 21, 2010 4:01 PM Call placed to: Patient Summary of Call: Recieved a fax from Dr. Webb Silversmith office stating that the pt is saying that we manage her coumadin. Called pt to clarify that Dr. Mariah Milling is no loger monitoring her Coumadin. She stated that at her last check she was told that her next check would be october and that it wasn't time. She stated "You will need to call Dr. Webb Silversmith office to tell them what I need to take." I explained that she did not want to come to the office for these coumadin check and wanted to continue going to [facility] for blood draws and that Per [place] policy we do not do lab draws.  Because of her choice to go to [facility] her coumadin was being taken over by Dr. Webb Silversmith office.  Initial call taken by: Benedict Needy, RN,  May 21, 2010 4:12 PM  Follow-up for Phone Call        Spoke to Dr. Webb Silversmith nurse she is aware that the pt's coumadin was taken over by Dr. Burnett Sheng.  She will call pt. Benedict Needy, RN  May 21, 2010 4:13 PM

## 2010-09-23 NOTE — Medication Information (Signed)
Summary: Coumadin Clinic  Anticoagulant Therapy  Managed by: Charlena Cross, RN, BSN Referring MD: Mariah Milling PCP: Dr. Moses Manners MD: Shirlee Latch MD, Jamilette Suchocki Indication 1: Atrial Fibrillation Lab Used: [facility] [place] Site: [place] PT 26.7 INR RANGE 2.0-3.0  Dietary changes: no    Health status changes: no    Bleeding/hemorrhagic complications: no    Recent/future hospitalizations: no    Any changes in medication regimen? no    Recent/future dental: no  Any missed doses?: no       Is patient compliant with meds? yes       Allergies: No Known Drug Allergies  Anticoagulation Management History:      Her anticoagulation is being managed by telephone today.  Positive risk factors for bleeding include an age of 35 years or older.  The bleeding index is 'intermediate risk'.  Positive CHADS2 values include History of HTN and Age > 76 years old.  Today's INR is 2.5.  Prothrombin time is 26.7.  Anticoagulation responsible provider: Shirlee Latch MD, Lilyann Gravelle.    Anticoagulation Management Assessment/Plan:      The patient's current anticoagulation dose is Coumadin 2.5 mg tabs: as directed.  The target INR is 2.0-3.0.  The next INR is due 12/04/2009.  Anticoagulation instructions were given to patient.  Results were reviewed/authorized by Charlena Cross, RN, BSN.  She was notified by Charlena Cross, RN, BSN.         Current Anticoagulation Instructions: The patient is to continue with the same dose of coumadin.  This dosage includes: 2.5 mg daily with 3 mg on T Th Sa

## 2010-09-23 NOTE — Op Note (Signed)
Summary: Operative Report  Operative Report   Imported By: Harlon Flor 09/27/2009 11:37:01  _____________________________________________________________________  External Attachment:    Type:   Image     Comment:   External Document

## 2010-09-25 NOTE — Medication Information (Signed)
Summary: Coumadin Clinic  Anticoagulant Therapy  Managed by: Inactive Referring MD: Mariah Milling PCP: Dr. Moses Manners MD: Mariah Milling Indication 1: Atrial Fibrillation Lab Used: [facility] [place] Site: [place] INR RANGE 2.0-3.0          Comments: Pt's primary MD, Dr Burnett Sheng now manages pt's Coumadin. Cloyde Reams RN  August 13, 2010 3:44 PM   Allergies: No Known Drug Allergies  Anticoagulation Management History:      Positive risk factors for bleeding include an age of 66 years or older.  The bleeding index is 'intermediate risk'.  Positive CHADS2 values include History of HTN and Age > 40 years old.  Her last INR was 2.2.  Anticoagulation responsible provider: Gollan.  Exp: 03/2011.    Anticoagulation Management Assessment/Plan:      The patient's current anticoagulation dose is Coumadin 2.5 mg tabs: as directed, Warfarin sodium 3 mg tabs: take as directed.  The target INR is 2.0-3.0.  The next INR is due 06/04/2010.  Anticoagulation instructions were given to patient.  Results were reviewed/authorized by Inactive.         Prior Anticoagulation Instructions: INR 2.1  Attempted to call pt with results.  NA and Machine not on! Cloyde Reams RN  May 12, 2010 4:21 PM  Called spoke with pt advised of results.  Advised to continue on same dosage 1.5 tablets daily except 1 tablet on Sundays, Tuesdays, and Thursdays.  Recheck in 4 weeks.  Pt made f/u appt.

## 2010-12-02 IMAGING — CT CT SIM MISC
1 series · 16 of 33 positions shown, 20 images · non-contrast
Comparison: none

[Series 2: — · axial · 1.17mm/px · z∈[-860,-539]mm · 16 of 109 slices shown, 20 images]
[im 5/109  mediastinal]
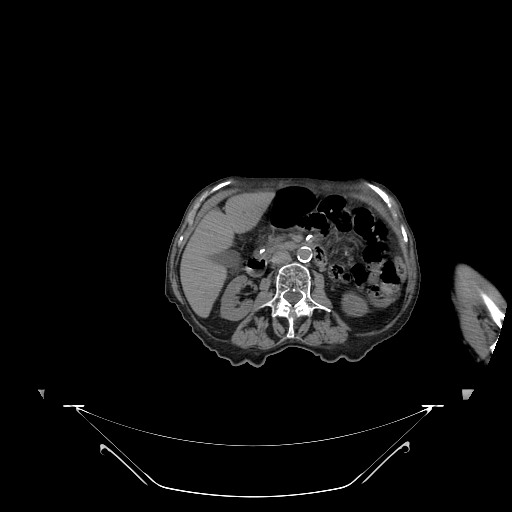
[im 5/109  lung]
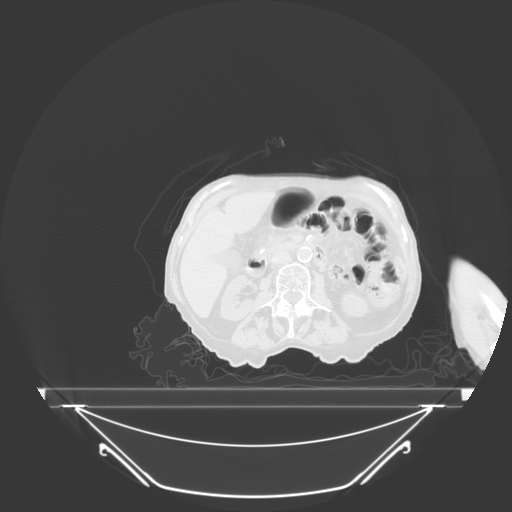
[im 13/109  lung]
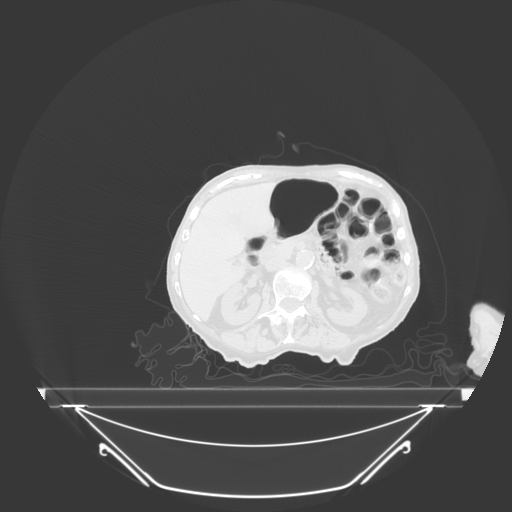
[im 21/109  lung]
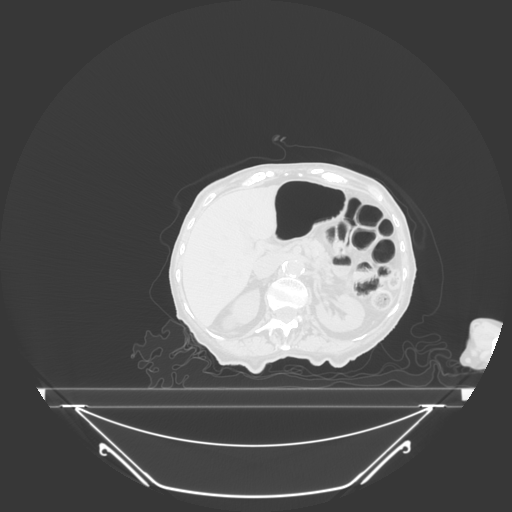
[im 25/109  lung]
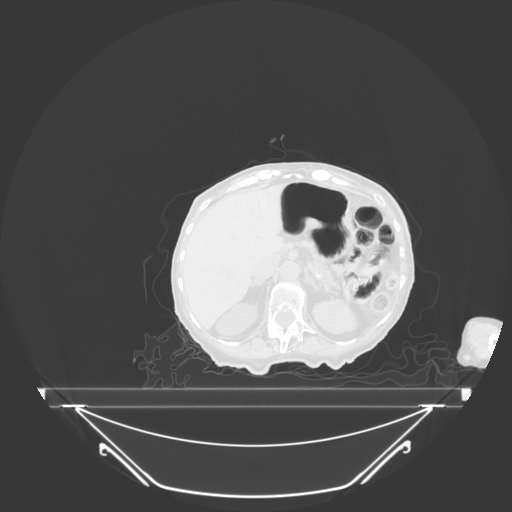
[im 33/109  mediastinal]
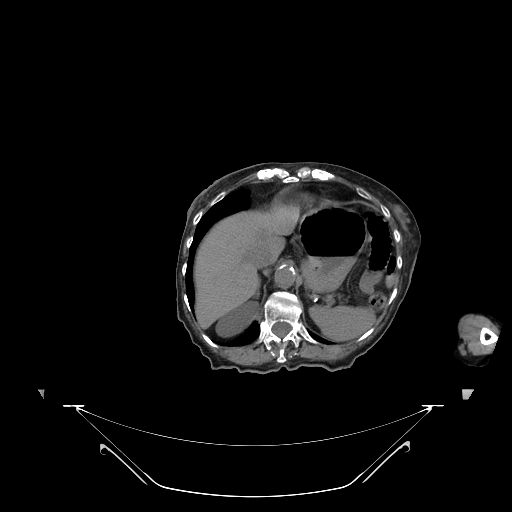
[im 33/109  lung]
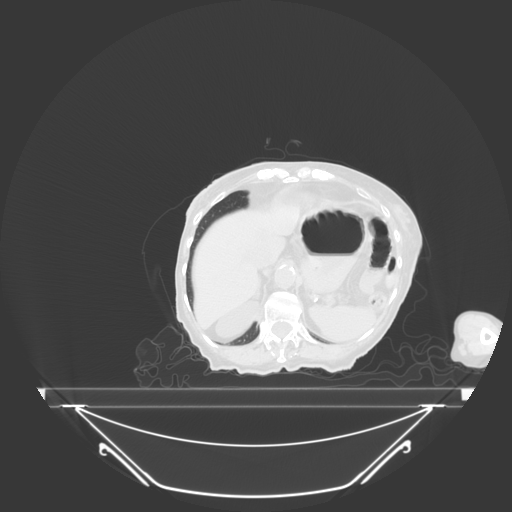
[im 41/109  lung]
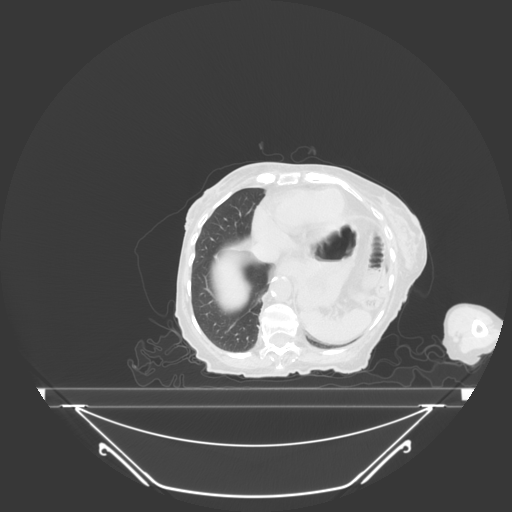
[im 45/109  lung]
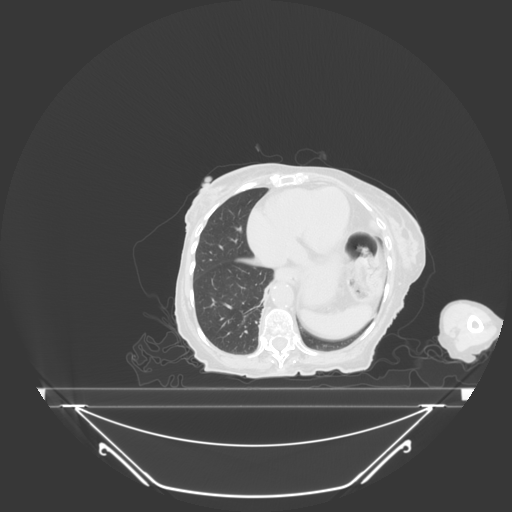
[im 53/109  lung]
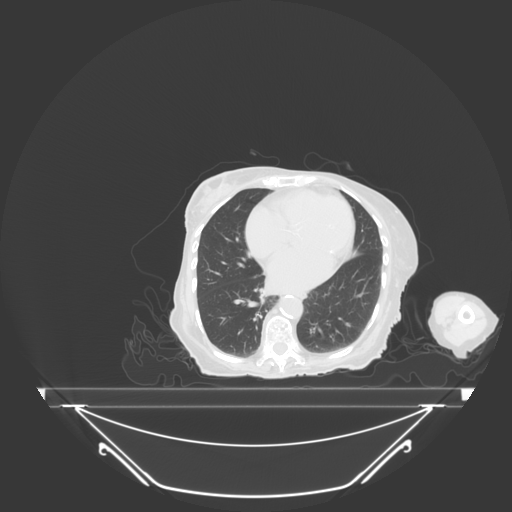
[im 58/109  mediastinal]
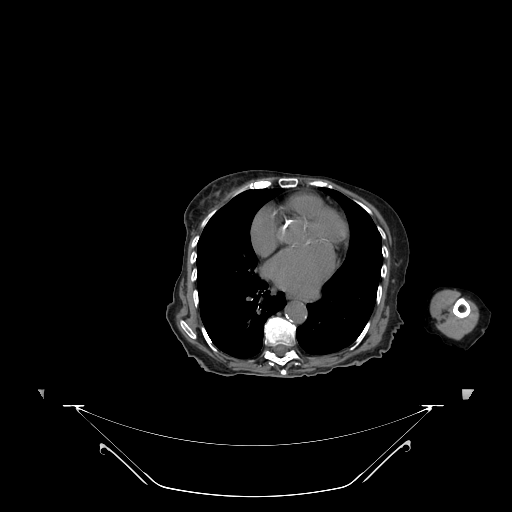
[im 58/109  lung]
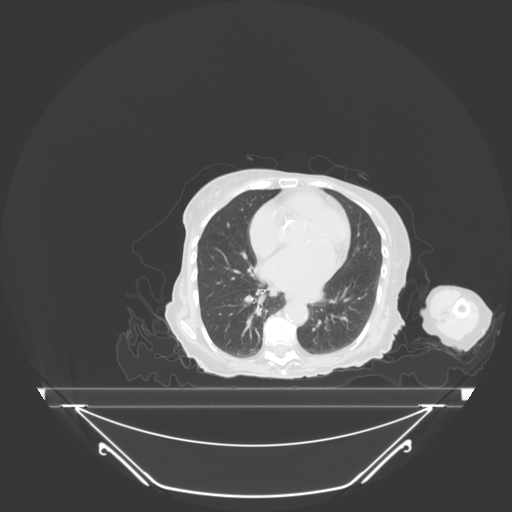
[im 63/109  lung]
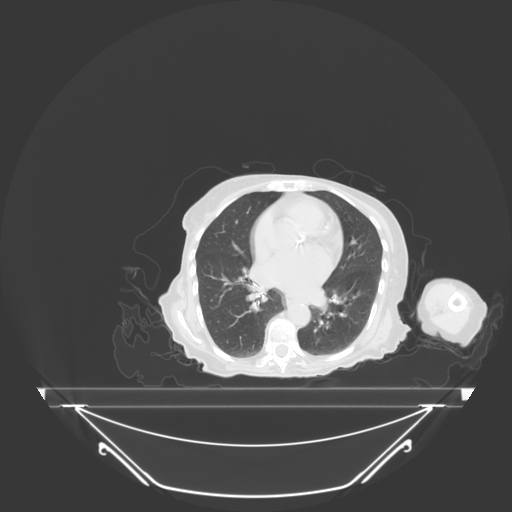
[im 69/109  lung]
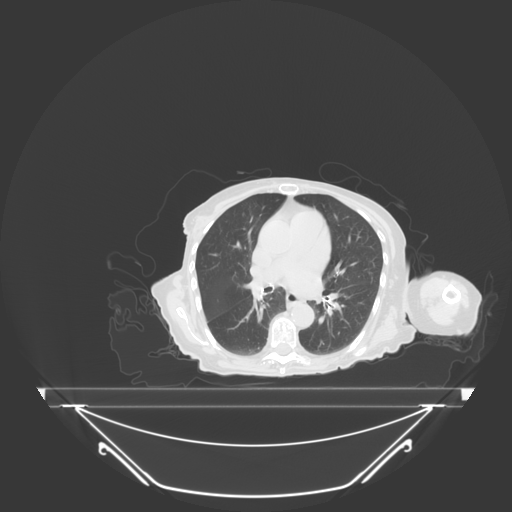
[im 77/109  lung]
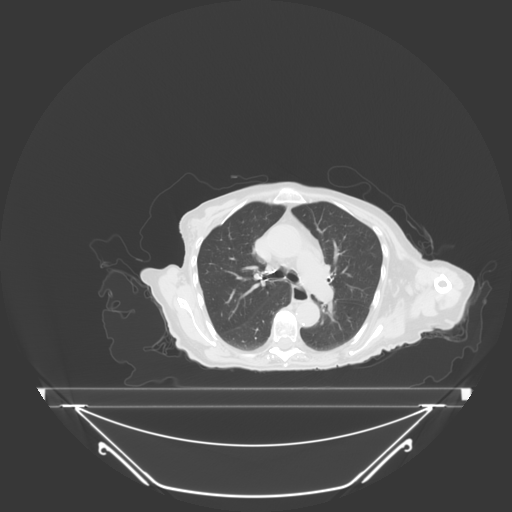
[im 85/109  mediastinal]
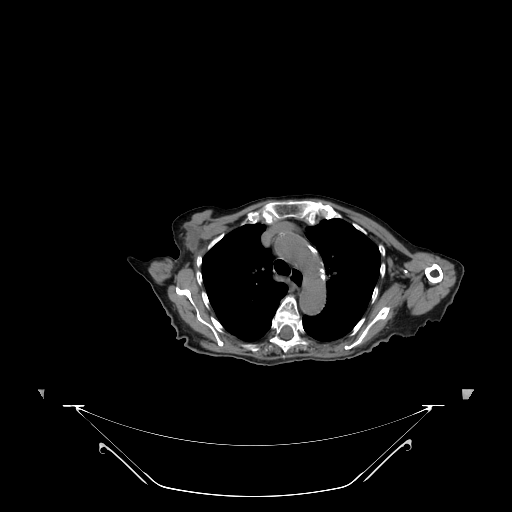
[im 85/109  lung]
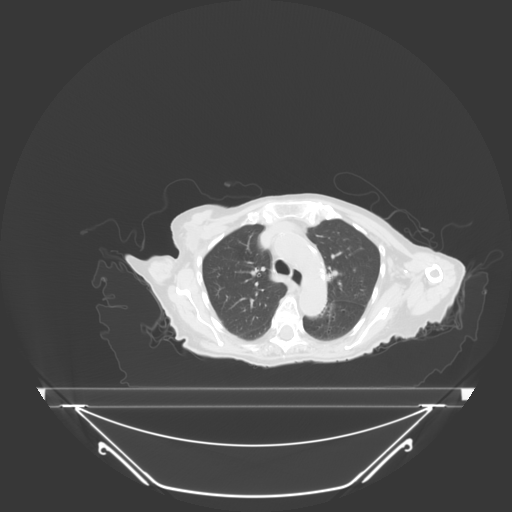
[im 89/109  lung]
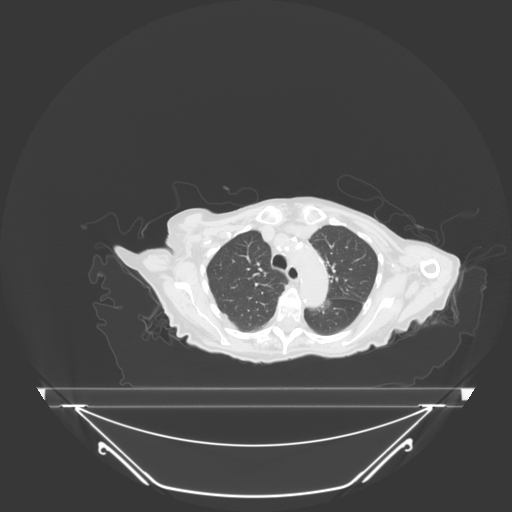
[im 97/109  lung]
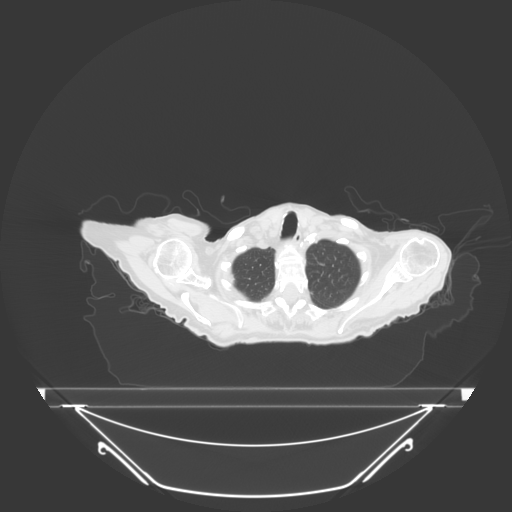
[im 105/109  lung]
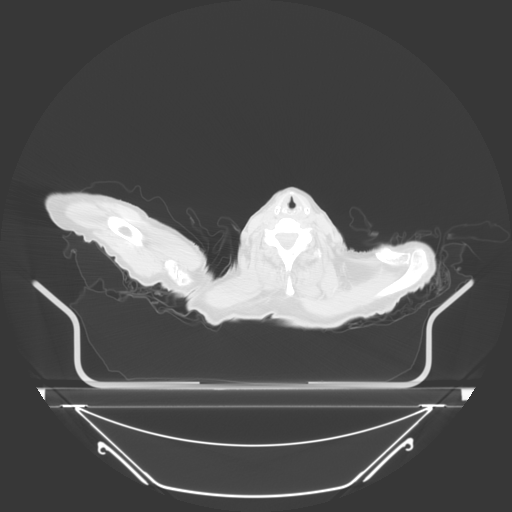

[16 of 33 positions shown; findings below may reference images not displayed]

IMAGES IMPORTED FROM THE [employer] WORKFLOW SYSTEM
NO DICTATION FOR STUDY

## 2010-12-15 ENCOUNTER — Telehealth: Payer: Self-pay

## 2010-12-15 MED ORDER — METOPROLOL TARTRATE 50 MG PO TABS: ORAL_TABLET | ORAL | Status: DC

## 2010-12-15 NOTE — Telephone Encounter (Signed)
Needs a refill on Metoprolol Tartrate 50 mg take 1 1/2 tablets twice a day for 90 day supply with 3 refills sent to [facility].

## 2011-01-21 ENCOUNTER — Ambulatory Visit (INDEPENDENT_AMBULATORY_CARE_PROVIDER_SITE_OTHER): Payer: Medicare Other | Admitting: Cardiovascular Disease

## 2011-01-21 ENCOUNTER — Encounter: Payer: Self-pay | Admitting: Cardiovascular Disease

## 2011-01-21 DIAGNOSIS — I10 Essential (primary) hypertension: Secondary | ICD-10-CM

## 2011-01-21 DIAGNOSIS — I4891 Unspecified atrial fibrillation: Secondary | ICD-10-CM

## 2011-01-21 DIAGNOSIS — R609 Edema, unspecified: Secondary | ICD-10-CM

## 2011-01-21 NOTE — Assessment & Plan Note (Signed)
Rate is borderline elevated. She is relatively asymptomatic. We have not changed medications at this time

## 2011-01-21 NOTE — Patient Instructions (Signed)
You are doing well. No medication changes were made. Please take lasix as needed for worsening edema Please call us if you have new issues that need to be addressed before your next appt.  We will call you for a follow up Appt. In 6 months

## 2011-01-21 NOTE — Assessment & Plan Note (Signed)
Edema is likely secondary to venous insufficiency. She is reluctant to try Lasix even p.r.n. We have suggested she try Lasix if her edema gets worse or she has shortness of breath.

## 2011-01-21 NOTE — Progress Notes (Signed)
   Patient ID: Rebecca Jennings, female    DOB: [birth year], 77 y.o.   MRN: [id number]  HPI Comments: Rebecca Jennings is a very pleasant 77 year old woman with a history of chronic atrial fibrillation, on Coumadin with rate control, negative stress test in August 2009 with no known coronary artery disease who presents for routine followup,   H/o right mastectomy for breast cancer. Following the surgery, she had atrial fibrillation with RVR. She was given a dose of Cardizem and her rate improved.   She reports that she is doing well. She does not take Lasix on a regular basis. She has had continued lower extremity edema and does not wear TED hose. She tries leg elevation. She is not particularly bothered by her edema. She reports that if she takes Lasix, it causes significant urination which is bothersome to her.  She denies any shortness of breath. She has been active, she continues to drive. She has been getting her condo that he at [facility] to accommodate her husband who is recovering from a stroke. She eats a significant amount thought despite this she has had difficulty maintaining weight. She also has arthritis that bothers her particularly in the morning.   EKG shows atrial fibrillation with ventricular rate 94 beats per minute, no significant ST or T wave changes      Review of Systems  Constitutional: Negative.   HENT: Negative.   Eyes: Negative.   Respiratory: Negative.   Cardiovascular: Positive for leg swelling.  Gastrointestinal: Negative.   Musculoskeletal: Positive for gait problem.  Skin: Negative.   Neurological: Negative.   Hematological: Negative.   Psychiatric/Behavioral: Negative.   All other systems reviewed and are negative.    BP 140/82  Ht 5\' 8"  (1.727 m)  Wt 120 lb (54.432 kg)  BMI 18.25 kg/m2   Physical Exam  Nursing note and vitals reviewed. Constitutional: She is oriented to person, place, and time. She appears well-developed and well-nourished.  HENT:   Head: Normocephalic.  Nose: Nose normal.  Mouth/Throat: Oropharynx is clear and moist.  Eyes: Conjunctivae are normal. Pupils are equal, round, and reactive to light.  Neck: Normal range of motion. Neck supple. No JVD present.  Cardiovascular: Normal rate, S1 normal, S2 normal and intact distal pulses.  An irregularly irregular rhythm present. Exam reveals no gallop and no friction rub.   Murmur heard.  Crescendo systolic murmur is present with a grade of 2/6       1+ edema in the legs that is nonpitting, appears tight and chronic, woody with loose socks in place  Pulmonary/Chest: Effort normal and breath sounds normal. No respiratory distress. She has no wheezes. She has no rales. She exhibits no tenderness.  Abdominal: Soft. Bowel sounds are normal. She exhibits no distension. There is no tenderness.  Musculoskeletal: Normal range of motion. She exhibits no edema and no tenderness.  Lymphadenopathy:    She has no cervical adenopathy.  Neurological: She is alert and oriented to person, place, and time. Coordination normal.  Skin: Skin is warm and dry. No rash noted. No erythema.  Psychiatric: She has a normal mood and affect. Her behavior is normal. Judgment and thought content normal.         Assessment and Plan

## 2011-01-21 NOTE — Assessment & Plan Note (Signed)
Blood pressure is adequate on today's visit. No changes made to the medications.

## 2011-02-04 ENCOUNTER — Other Ambulatory Visit: Payer: Self-pay

## 2011-02-17 ENCOUNTER — Telehealth: Payer: Self-pay | Admitting: *Deleted

## 2011-02-17 ENCOUNTER — Encounter: Payer: Self-pay | Admitting: Cardiovascular Disease

## 2011-02-17 NOTE — Telephone Encounter (Signed)
Opened in error

## 2011-02-19 ENCOUNTER — Telehealth: Payer: Self-pay | Admitting: *Deleted

## 2011-02-26 NOTE — Telephone Encounter (Signed)
Opened in error

## 2011-06-17 ENCOUNTER — Telehealth: Payer: Self-pay

## 2011-06-17 MED ORDER — METOPROLOL TARTRATE 50 MG PO TABS: ORAL_TABLET | ORAL | Status: DC

## 2011-06-17 NOTE — Telephone Encounter (Signed)
Refill sent for metoprolol tart 50 mg take one and half tablet bid.

## 2011-07-06 ENCOUNTER — Encounter: Payer: Self-pay | Admitting: Cardiovascular Disease

## 2011-07-07 ENCOUNTER — Ambulatory Visit: Payer: Medicare Other | Admitting: Cardiovascular Disease

## 2011-08-25 HISTORY — PX: OTHER SURGICAL HISTORY: SHX169

## 2011-11-19 ENCOUNTER — Other Ambulatory Visit: Payer: Self-pay | Admitting: Cardiovascular Disease

## 2011-11-19 MED ORDER — HYDROCHLOROTHIAZIDE 25 MG PO TABS: 25.0000 mg | ORAL_TABLET | Freq: Every day | ORAL | Status: DC

## 2011-11-23 ENCOUNTER — Other Ambulatory Visit: Payer: Self-pay

## 2011-11-23 MED ORDER — HYDROCHLOROTHIAZIDE 25 MG PO TABS: 25.0000 mg | ORAL_TABLET | Freq: Every day | ORAL | Status: DC

## 2011-11-23 NOTE — Telephone Encounter (Signed)
.   Requested Prescriptions   Signed Prescriptions Disp Refills  . hydrochlorothiazide (HYDRODIURIL) 25 MG tablet 30 tablet 6    Sig: Take 1 tablet (25 mg total) by mouth daily.    Authorizing Provider: Antonieta Iba    Ordering User: [facility]

## 2012-04-01 ENCOUNTER — Telehealth: Payer: Self-pay | Admitting: Cardiovascular Disease

## 2012-04-01 NOTE — Telephone Encounter (Signed)
Error

## 2012-04-11 ENCOUNTER — Telehealth: Payer: Self-pay

## 2012-04-11 MED ORDER — METOPROLOL TARTRATE 50 MG PO TABS: ORAL_TABLET | ORAL | Status: DC

## 2012-04-11 NOTE — Telephone Encounter (Signed)
Refill sent for 90 day supply for metoprolol tart [facility]

## 2012-04-18 ENCOUNTER — Telehealth: Payer: Self-pay | Admitting: Cardiovascular Disease

## 2012-04-18 NOTE — Telephone Encounter (Signed)
Error

## 2012-05-03 ENCOUNTER — Encounter: Payer: Self-pay | Admitting: Cardiovascular Disease

## 2012-05-03 ENCOUNTER — Ambulatory Visit (INDEPENDENT_AMBULATORY_CARE_PROVIDER_SITE_OTHER): Payer: Medicare Other | Admitting: Cardiovascular Disease

## 2012-05-03 VITALS — BP 120/60 | HR 92 | Ht 60.0 in | Wt 108.2 lb

## 2012-05-03 DIAGNOSIS — I4891 Unspecified atrial fibrillation: Secondary | ICD-10-CM

## 2012-05-03 DIAGNOSIS — R609 Edema, unspecified: Secondary | ICD-10-CM

## 2012-05-03 DIAGNOSIS — I10 Essential (primary) hypertension: Secondary | ICD-10-CM

## 2012-05-03 NOTE — Assessment & Plan Note (Signed)
She continues to have pitting edema. We have suggested she stay on her Lasix daily.

## 2012-05-03 NOTE — Assessment & Plan Note (Addendum)
Rate is well controlled. No changes to her medications. We will check her INR today (it is 2.1). No changes to her warfarin.

## 2012-05-03 NOTE — Assessment & Plan Note (Signed)
Blood pressure is well controlled on today's visit. No changes made to the medications. 

## 2012-05-03 NOTE — Progress Notes (Signed)
Patient ID: Rebecca Jennings, female    DOB: [birth year], 78 y.o.   MRN: [id number]  HPI Comments: Rebecca Jennings is a very pleasant 78 year old woman with a history of chronic atrial fibrillation, on Coumadin with rate control, negative stress test in August 2009 with no known coronary artery disease who presents for routine followup,   H/o right mastectomy for breast cancer. Following the surgery, she had atrial fibrillation with RVR. She was given a dose of Cardizem and her rate improved.   She reports that she is doing well. She has been taking Lasix recently on a more regular basis for lower extremity edema . She also takes HCTZ. Otherwise she feels well. She is unable to wear TED hose.  She denies any shortness of breath. She has been active, she continues to drive. She eats a significant amount thought despite this she has had difficulty maintaining weight. She also has arthritis that bothers her particularly in the morning.   EKG shows atrial fibrillation with ventricular rate 82 beats per minute, no significant ST or T wave changes   Outpatient Encounter Prescriptions as of 05/03/2012  Medication Sig Dispense Refill  . alendronate (FOSAMAX) 70 MG tablet Take 70 mg by mouth every 7 (seven) days. Take with a full glass of water on an empty stomach.      Marland Kitchen aspirin 81 MG tablet Take 81 mg by mouth daily.        . Calcium Carbonate-Vitamin D (CALTRATE 600+D) 600-400 MG-UNIT per tablet Take 1 tablet by mouth 2 (two) times daily.      . cetaphil (CETAPHIL) cream Apply topically as needed.      . DESONIDE EX Apply topically.      Tery Sanfilippo Calcium (STOOL SOFTENER PO) Take by mouth as needed.      . furosemide (LASIX) 20 MG tablet Take 20 mg by mouth daily.       . hydrochlorothiazide (HYDRODIURIL) 25 MG tablet Take 1 tablet (25 mg total) by mouth daily.  30 tablet  6  . letrozole (FEMARA) 2.5 MG tablet Take 2.5 mg by mouth daily.        . metoprolol (LOPRESSOR) 50 MG tablet Take one and half  tablets (75 mg) twice a day.  270 tablet  3  . triamcinolone cream (KENALOG) 0.1 % Apply topically 2 (two) times daily.      Marland Kitchen warfarin (COUMADIN) 3 MG tablet Take 3 mg by mouth daily.        Marland Kitchen DISCONTD: meloxicam (MOBIC) 15 MG tablet Take 15 mg by mouth daily.           Review of Systems  Constitutional: Negative.   HENT: Negative.   Eyes: Negative.   Respiratory: Negative.   Cardiovascular: Positive for leg swelling.  Gastrointestinal: Negative.   Musculoskeletal: Positive for gait problem.  Skin: Negative.   Neurological: Negative.   Hematological: Negative.   Psychiatric/Behavioral: Negative.   All other systems reviewed and are negative.    BP 120/60  Pulse 92  Ht 5' (1.524 m)  Wt 108 lb 4 oz (49.102 kg)  BMI 21.14 kg/m2  Physical Exam  Nursing note and vitals reviewed. Constitutional: She is oriented to person, place, and time. She appears well-developed and well-nourished.  HENT:  Head: Normocephalic.  Nose: Nose normal.  Mouth/Throat: Oropharynx is clear and moist.  Eyes: Conjunctivae are normal. Pupils are equal, round, and reactive to light.  Neck: Normal range of motion. Neck supple. No JVD present.  Cardiovascular:  Normal rate, S1 normal, S2 normal and intact distal pulses.  An irregularly irregular rhythm present. Exam reveals no gallop and no friction rub.   Murmur heard.  Crescendo systolic murmur is present with a grade of 2/6       Trace to 1+ edema in the legs that is pitting  Pulmonary/Chest: Effort normal and breath sounds normal. No respiratory distress. She has no wheezes. She has no rales. She exhibits no tenderness.  Abdominal: Soft. Bowel sounds are normal. She exhibits no distension. There is no tenderness.  Musculoskeletal: Normal range of motion. She exhibits no edema and no tenderness.  Lymphadenopathy:    She has no cervical adenopathy.  Neurological: She is alert and oriented to person, place, and time. Coordination normal.  Skin: Skin is  warm and dry. No rash noted. No erythema.  Psychiatric: She has a normal mood and affect. Her behavior is normal. Judgment and thought content normal.         Assessment and Plan

## 2012-05-03 NOTE — Patient Instructions (Addendum)
You are doing well. No medication changes were made. Please call [facility] to set up a new Primary Care  Stay on same dose of warfarin and recheck in 4-6 weeks at [facility] INR=2.1  Please call us if you have new issues that need to be addressed before your next appt.  Your physician wants you to follow-up in: 6 months.  You will receive a reminder letter in the mail two months in advance. If you don't receive a letter, please call our office to schedule the follow-up appointment.

## 2012-05-20 ENCOUNTER — Other Ambulatory Visit: Payer: Self-pay | Admitting: Cardiovascular Disease

## 2012-05-20 MED ORDER — WARFARIN SODIUM 3 MG PO TABS: 3.0000 mg | ORAL_TABLET | Freq: Every day | ORAL | Status: DC

## 2012-05-20 NOTE — Telephone Encounter (Signed)
Pt wants to know if Dr Mariah Milling will fill her coumadin until she sees Dr Sharen Hones

## 2012-05-23 ENCOUNTER — Other Ambulatory Visit: Payer: Self-pay

## 2012-05-23 MED ORDER — WARFARIN SODIUM 3 MG PO TABS: 3.0000 mg | ORAL_TABLET | Freq: Every day | ORAL | Status: DC

## 2012-05-26 ENCOUNTER — Telehealth: Payer: Self-pay

## 2012-05-26 NOTE — Telephone Encounter (Signed)
Pt sent you a hand written letter saying she found new PCP (Dr. Sharen Hones) who will manage her coumadin.  She cannot get appt until 11/19. She asks if we can manage her coumadin until then. She can have this drawn at [facility] and have them fax [place] the results if you approve.  She needs to have INR checked 10/19 as well as 11/12.  Is this ok with you?

## 2012-05-27 NOTE — Telephone Encounter (Signed)
Ok with me--thx 

## 2012-06-06 ENCOUNTER — Telehealth: Payer: Self-pay

## 2012-06-06 ENCOUNTER — Other Ambulatory Visit: Payer: Self-pay

## 2012-06-06 DIAGNOSIS — I4891 Unspecified atrial fibrillation: Secondary | ICD-10-CM

## 2012-06-06 NOTE — Telephone Encounter (Signed)
Faxing standing order for Pt/INR to [facility] to be done this week

## 2012-06-06 NOTE — Telephone Encounter (Signed)
PT INR

## 2012-06-07 LAB — PROTIME-INR

## 2012-06-08 ENCOUNTER — Other Ambulatory Visit: Payer: Self-pay | Admitting: Cardiovascular Disease

## 2012-06-08 DIAGNOSIS — I4891 Unspecified atrial fibrillation: Secondary | ICD-10-CM

## 2012-06-17 ENCOUNTER — Telehealth: Payer: Self-pay | Admitting: Cardiovascular Disease

## 2012-06-17 ENCOUNTER — Other Ambulatory Visit: Payer: Self-pay | Admitting: *Deleted

## 2012-06-17 MED ORDER — HYDROCHLOROTHIAZIDE 25 MG PO TABS: 25.0000 mg | ORAL_TABLET | Freq: Every day | ORAL | Status: DC

## 2012-06-17 NOTE — Telephone Encounter (Signed)
Pt aware of INR results.  ( INR 2.6)   She will not be seeing her new pcp until 07/12/12.  She plans on having another INR drawn and faxed to Dr. Mariah Milling on 07/05/12  Mylo Red RN

## 2012-06-17 NOTE — Telephone Encounter (Signed)
Pt calling wanting to know about INR results from 10/15.

## 2012-06-17 NOTE — Telephone Encounter (Signed)
Refilled Hydrochlorothiazide. 

## 2012-06-22 ENCOUNTER — Other Ambulatory Visit: Payer: Self-pay

## 2012-06-22 MED ORDER — HYDROCHLOROTHIAZIDE 25 MG PO TABS: 25.0000 mg | ORAL_TABLET | Freq: Every day | ORAL | Status: DC

## 2012-06-22 NOTE — Telephone Encounter (Signed)
Refill sent for HCTZ 25 mg to [employer] order.

## 2012-06-24 ENCOUNTER — Other Ambulatory Visit: Payer: Self-pay

## 2012-06-24 MED ORDER — METOPROLOL TARTRATE 50 MG PO TABS: ORAL_TABLET | ORAL | Status: DC

## 2012-06-29 ENCOUNTER — Other Ambulatory Visit: Payer: Self-pay

## 2012-06-29 MED ORDER — METOPROLOL TARTRATE 50 MG PO TABS: ORAL_TABLET | ORAL | Status: DC

## 2012-06-29 NOTE — Telephone Encounter (Signed)
Refill sent for 90 day supply for metoprolol 50 mg take one and one-half tablet twice a day.

## 2012-07-05 ENCOUNTER — Other Ambulatory Visit: Payer: Self-pay | Admitting: Cardiovascular Disease

## 2012-07-12 ENCOUNTER — Ambulatory Visit (INDEPENDENT_AMBULATORY_CARE_PROVIDER_SITE_OTHER): Payer: Medicare Other | Admitting: Family Medicine

## 2012-07-12 ENCOUNTER — Encounter: Payer: Self-pay | Admitting: Family Medicine

## 2012-07-12 VITALS — BP 116/78 | HR 68 | Temp 98.7°F | Ht 65.5 in | Wt 111.8 lb

## 2012-07-12 DIAGNOSIS — Z66 Do not resuscitate: Secondary | ICD-10-CM

## 2012-07-12 DIAGNOSIS — Z853 Personal history of malignant neoplasm of breast: Secondary | ICD-10-CM

## 2012-07-12 DIAGNOSIS — I4891 Unspecified atrial fibrillation: Secondary | ICD-10-CM

## 2012-07-12 DIAGNOSIS — I872 Venous insufficiency (chronic) (peripheral): Secondary | ICD-10-CM

## 2012-07-12 DIAGNOSIS — I8310 Varicose veins of unspecified lower extremity with inflammation: Secondary | ICD-10-CM

## 2012-07-12 DIAGNOSIS — M810 Age-related osteoporosis without current pathological fracture: Secondary | ICD-10-CM

## 2012-07-12 DIAGNOSIS — I10 Essential (primary) hypertension: Secondary | ICD-10-CM

## 2012-07-12 DIAGNOSIS — Z7901 Long term (current) use of anticoagulants: Secondary | ICD-10-CM

## 2012-07-12 NOTE — Progress Notes (Signed)
Subjective:    Patient ID: Rebecca Jennings, female    DOB: 06-20-1923, 78 y.o.   MRN: [id number]  HPI CC: new pt to establish  Presents alone today.  Previously saw Dr. Kathrene Alu.  Would like DNR form signed today - done.  Lives at [facility].  Does not drive.  Sold car.  Sees dermatologist for dry skin (stasis dermatitis).  Uses cetaphil and triamcinolone cream and desonide cream.  Has been having hand and wrist tenderness.  Thinks started after sitting in different seat.  Getting better.  Wears hearing aides.  ?abnormal mammogram in 2011.  Gives confusing history, states had breast cancer but denies mastectomy or lumpectomy, states only had biopsy.  No chemo or radiation.  Afib with RVR - takes coumadin regularly.  Followed by [facility].  Wants [place] to follow coumadin but currently going [facility] for blood draws.  Preventative: Next mammogram due 07/2012.  Last done 01/2012. Colonoscopy 2009  Current physicians - Dr. Mariah Milling, Dr. Al Corpus (podiatry), Dr.Kleinmann [facility]), Dr. Sueanne Margarita [facility]), Dr. Lemar Livings (Surgeon)  Medications and allergies reviewed and updated in chart.  Past histories reviewed and updated if relevant as below. Patient Active Problem List  Diagnosis  . HYPERTENSION, BENIGN  . ATRIAL FIBRILLATION  . EDEMA   Past Medical History  Diagnosis Date  . Atrial fibrillation     [place])  . Osteoporosis   . HTN (hypertension)   . History of breast cancer     unclear, have requested records   Past Surgical History  Procedure Date  . Breast biopsy 09/24/2009    right; benign  . Umbilical hernia repair 1994  . Breast biopsy 1998    left; benign  . Cataract extraction 2008;2009    right;left  . Breast biopsy 1982    right; benign   History  Substance Use Topics  . Smoking status: Never Smoker   . Smokeless tobacco: Never Used  . Alcohol Use: No   Family History  Problem Relation Age of Onset  . Sudden death Father     <50yo  .  Cancer Neg Hx   . Diabetes Neg Hx   . CAD Neg Hx   . Stroke Neg Hx    No Known Allergies Current Outpatient Prescriptions on File Prior to Visit  Medication Sig Dispense Refill  . alendronate (FOSAMAX) 70 MG tablet Take 70 mg by mouth every 7 (seven) days. Take with a full glass of water on an empty stomach.      Marland Kitchen aspirin 81 MG tablet Take 81 mg by mouth daily.        . Calcium Carbonate-Vitamin D (CALTRATE 600+D) 600-400 MG-UNIT per tablet Take 1 tablet by mouth 2 (two) times daily.      . cetaphil (CETAPHIL) cream Apply topically as needed.      . DESONIDE EX Apply topically.      . furosemide (LASIX) 20 MG tablet Take 20 mg by mouth daily.       . hydrochlorothiazide (HYDRODIURIL) 25 MG tablet Take 1 tablet (25 mg total) by mouth daily.  90 tablet  3  . letrozole (FEMARA) 2.5 MG tablet Take 2.5 mg by mouth daily.        . metoprolol (LOPRESSOR) 50 MG tablet Take one and one-half tablet twice a day.  270 tablet  3  . triamcinolone cream (KENALOG) 0.1 % Apply topically 2 (two) times daily.      Marland Kitchen warfarin (COUMADIN) 3 MG tablet Take 1  tablet (3 mg total) by mouth daily.  30 tablet  0  . [DISCONTINUED] warfarin (COUMADIN) 3 MG tablet Take 1 tablet (3 mg total) by mouth daily.  30 tablet  0    Review of Systems  Constitutional: Negative for fever, chills, activity change, appetite change, fatigue and unexpected weight change.  HENT: Negative for hearing loss and neck pain.   Eyes: Negative for visual disturbance.  Respiratory: Negative for cough, chest tightness, shortness of breath and wheezing.   Cardiovascular: Negative for chest pain, palpitations and leg swelling.  Gastrointestinal: Negative for nausea, vomiting, abdominal pain, diarrhea, constipation, blood in stool and abdominal distention.  Genitourinary: Negative for hematuria and difficulty urinating.  Musculoskeletal: Negative for myalgias and arthralgias.  Skin: Negative for rash.  Neurological: Negative for dizziness,  seizures, syncope and headaches.  Psychiatric/Behavioral: Negative for dysphoric mood. The patient is not nervous/anxious.        Objective:   Physical Exam  Nursing note and vitals reviewed. Constitutional: She is oriented to person, place, and time. She appears well-developed and well-nourished. No distress.       Thin elderly CF  HENT:  Head: Normocephalic and atraumatic.  Right Ear: Hearing, tympanic membrane, external ear and ear canal normal.  Left Ear: Hearing, tympanic membrane, external ear and ear canal normal.  Nose: Nose normal.  Mouth/Throat: Oropharynx is clear and moist. No oropharyngeal exudate.  Eyes: Conjunctivae normal and EOM are normal. Pupils are equal, round, and reactive to light. No scleral icterus.  Neck: Normal range of motion. Neck supple. Carotid bruit is not present.  Cardiovascular: Normal rate, normal heart sounds and intact distal pulses.  An irregular rhythm present.  No murmur heard. Pulses:      Radial pulses are 2+ on the right side, and 2+ on the left side.  Pulmonary/Chest: Effort normal and breath sounds normal. No respiratory distress. She has no wheezes. She has no rales.  Abdominal: Soft. Bowel sounds are normal. She exhibits no distension and no mass. There is no tenderness. There is no rebound and no guarding.  Musculoskeletal: Normal range of motion. She exhibits no edema.  Lymphadenopathy:    She has no cervical adenopathy.  Neurological: She is alert and oriented to person, place, and time.       CN grossly intact Very slowed with gait, shuffling  Skin: Skin is warm and dry. No rash noted.  Psychiatric: She has a normal mood and affect. Her behavior is normal. Judgment and thought content normal.       Assessment & Plan:

## 2012-07-12 NOTE — Patient Instructions (Addendum)
Good to meet you today, call us with questions. Return in next few months for medicare wellness visit, prior fasting for blood work. Let us know if you want [place] to check your coumadin levels.  If you do, we will start checking here.  If not, have Dr. Mariah Milling continue checking your coumadin level.

## 2012-07-13 ENCOUNTER — Telehealth: Payer: Self-pay

## 2012-07-13 NOTE — Telephone Encounter (Signed)
Attempted to call patient. Line was busy. Will try again later. 

## 2012-07-13 NOTE — Telephone Encounter (Signed)
Pt request Dr Sharen Hones to manage her Coumadin level and ck PT/INR. Pt request to have lab test done at [facility] which is near where pt lives. Pt does not want to come to [facility] lab due to distance from her home. Pt said Dr Mariah Milling wants her PCP to follow Coumadin and ck labs.Please advise.

## 2012-07-13 NOTE — Telephone Encounter (Signed)
This is ok. plz let pt know I am in process of finding out about nurse who can come to [facility] to check INR and fax me results - probably easier than getting done at [facility]. If she hasn't heard from me or [facility] in 1 wk to call us back.

## 2012-07-14 ENCOUNTER — Encounter: Payer: Self-pay | Admitting: *Deleted

## 2012-07-14 ENCOUNTER — Telehealth: Payer: Self-pay

## 2012-07-14 NOTE — Telephone Encounter (Signed)
Mandy independent living nurse at [facility] left v/m requesting PT and OT order to screen,evaluate and treat pt; pt is fall risk; has unsteady gait and is needing home and shower modifications. Mandy request order faxed to her atten. Please advise.

## 2012-07-14 NOTE — Telephone Encounter (Signed)
Pt called back to see if order had been faxed to [facility]. Explained to pt note had been sent to Dr Sharen Hones who was seeing pt's in office now. Pt wants call back at [phone] when order faxed to [facility].

## 2012-07-14 NOTE — Telephone Encounter (Signed)
Order faxed as directed. 

## 2012-07-14 NOTE — Telephone Encounter (Signed)
Spoke with patient. She said she still wants to go to [facility] for INR. I advised [facility] would send someone to her so she could avoid having to go somewhere and she said she didn't want that because she preferred to go to [facility] as before. She said to fax the order to them at [phone] and they would send you the results.

## 2012-07-14 NOTE — Telephone Encounter (Signed)
Ok to do. Thanks.  

## 2012-07-15 ENCOUNTER — Encounter: Payer: Self-pay | Admitting: Family Medicine

## 2012-07-15 ENCOUNTER — Telehealth: Payer: Self-pay | Admitting: Cardiovascular Disease

## 2012-07-15 DIAGNOSIS — Z7901 Long term (current) use of anticoagulants: Secondary | ICD-10-CM | POA: Insufficient documentation

## 2012-07-15 DIAGNOSIS — Z853 Personal history of malignant neoplasm of breast: Secondary | ICD-10-CM | POA: Insufficient documentation

## 2012-07-15 DIAGNOSIS — I872 Venous insufficiency (chronic) (peripheral): Secondary | ICD-10-CM | POA: Insufficient documentation

## 2012-07-15 DIAGNOSIS — M810 Age-related osteoporosis without current pathological fracture: Secondary | ICD-10-CM | POA: Insufficient documentation

## 2012-07-15 NOTE — Telephone Encounter (Signed)
Spoke with pt.  See office note from Tuesday.  She will follow with Dr. Mariah Milling for INR checks.

## 2012-07-15 NOTE — Telephone Encounter (Signed)
Pt spoke with Dr Mariah Milling this morning; pt has decided she wants Dr Sharen Hones to manage her PT/INR. Pt said does not need next lab test until 07/26/12; pt does want [facility] nurse to do the lab test.Please advise. Pt request call back [phone].

## 2012-07-15 NOTE — Telephone Encounter (Signed)
Attempted to call patient. No answer, no machine. Will try again later.  

## 2012-07-15 NOTE — Assessment & Plan Note (Signed)
Stable on current treatment per dermatology.

## 2012-07-15 NOTE — Telephone Encounter (Signed)
See below and advise Do I just need to send to [facility]? Thanks!

## 2012-07-15 NOTE — Assessment & Plan Note (Signed)
Seems well controlled on current regimen, continue to monitor.

## 2012-07-15 NOTE — Assessment & Plan Note (Signed)
I will touch base with Dr. Alphonsus Sias as he has several [facility] patients who are monitored at [facility].  Pt states she currently gets coumadin checked at [facility], has Zenaida Niece take her regularly to [facility].  ==> ADDENDUM: discussed with patient, recommended I feel it's safer for her to get INR checked at [facility] by [employer] who would email me results, pt adamant about not wanting another nurse involved in her care.  States she will continue to have Dr. Mariah Milling manage her coumadin rather than get it checked at [facility].

## 2012-07-15 NOTE — Telephone Encounter (Signed)
Patient called wanting to get the ok from Dr. Mariah Milling to continue to have her INR checks done by [facility].  She said Mila Merry MD told her that she needed home health RN to come draw her INR instead of continuing to go to [facility].  Please call back to discuss if ok to continue to go to [facility].

## 2012-07-15 NOTE — Assessment & Plan Note (Signed)
Will await records from Dr. Sueanne Margarita

## 2012-07-15 NOTE — Telephone Encounter (Signed)
plz notify I've spoken with Morrie Sheldon RN who should contact her to set this up.  If she hasn't heard from [facility] by next week to let us know. INRs at [facility] are checked on M and Thursdays at 7:30am so I would like first check to be Monday 07/25/2012.  I will let Morrie Sheldon know this.

## 2012-07-15 NOTE — Telephone Encounter (Signed)
Pt says she went to Dr. Danise Edge for the first time to establish PCP She used to be managed by PCP at [facility] for coumadin mngt and had to switch PCP's Dr. Mariah Milling agreed to manage coumadin until she became estab with Dr. Danise Edge She saw Dr. Danise Edge and was told they would manage coumadin but she would need to have [facility] nurse come to home to draw INRs instead of going to [facility] as she has been doing She wants Dr. Mariah Milling to manage for this reason I explained, per coumadin clinic,  She would have to come in to clinic for [place] to manage She says she does not want it done this way and asks why she cannot continue to have this drawn by [facility] and faxed in and manage this way i told her I would have to find answer from coumadin clinic and will call her back Understanding verb.

## 2012-07-15 NOTE — Telephone Encounter (Signed)
We explained in detail to pt if she wants [place] to monitor pt's Coumadin she must come into clinic.  We will not monitor long term labs drawn at [facility] and faxed into office we did this temporarily for pt as a favor in the past until she could establish with a PCP, but made it clear to her we would not continue to monitor outside INR's drawn at [facility]. Pt is aware of this as we have explained this to her many times in the past.

## 2012-07-15 NOTE — Assessment & Plan Note (Signed)
Chronic, stable. Continue lasix, HCTZ, metoprolol.

## 2012-07-15 NOTE — Assessment & Plan Note (Signed)
No report of dexa recently.  On alendronate and cal/vit D.  Continue.

## 2012-07-15 NOTE — Telephone Encounter (Signed)
Thanks!  She called back to office to tell us she has decided to have PCP manage this.

## 2012-07-18 ENCOUNTER — Other Ambulatory Visit: Payer: Self-pay

## 2012-07-18 MED ORDER — WARFARIN SODIUM 3 MG PO TABS: 3.0000 mg | ORAL_TABLET | Freq: Every day | ORAL | Status: DC

## 2012-07-18 NOTE — Telephone Encounter (Signed)
Patient notified and will wait to hear from [place]. She said she will call if she doesn't hear from her this week.

## 2012-07-18 NOTE — Telephone Encounter (Signed)
Pt request refill warfarin 3 mg to [facility] pharmacy. Notified pt done.

## 2012-07-20 NOTE — Telephone Encounter (Signed)
Pt called back and does not want to go to [facility] at 7:30 am to have blood test done. Pt wants Dr Reece Agar to fax lab order for PT/INR to [facility] at fax # [phone]. Pt will plan to go for lab test on Tues 07/26/12. Pt understands Dr Reece Agar out of office until [place]. Pt request call back when order faxed.

## 2012-07-22 NOTE — Telephone Encounter (Signed)
Patient called back again asking that order be sent to [facility], I advised that Dr.G was not in office and will be back on Monday, pt wants a call ASAP when order has been sent.

## 2012-07-24 ENCOUNTER — Encounter: Payer: Self-pay | Admitting: Family Medicine

## 2012-07-25 ENCOUNTER — Telehealth: Payer: Self-pay | Admitting: *Deleted

## 2012-07-25 NOTE — Telephone Encounter (Addendum)
Ok to send in order for INR check for tomorrow at [employer] [facility] given due so close - fax # below. plz notify pt sent.   However, as we discussed I will not continue following INRs from [facility] as I continue to recommend she get them checked at [facility] by RN - standard of care.  If desires to continue checking at [facility], will see if Dr. Mariah Milling willing to take this on.  Have routed note to him as well.

## 2012-07-25 NOTE — Telephone Encounter (Signed)
See recent phone note 

## 2012-07-25 NOTE — Telephone Encounter (Signed)
Pt called to have a standing order faxed to [facility] (fax [phone]) to have her INR checked. She states she she tried to go through [facility], but they wouldn't come to her house and she would have had to be there at 7:30. She also says Dr Mariah Milling used to monitor her INR, but that he has turned it all over to you. Pt request call back with order is faxed. She is supposed to have the labs tomorrow.

## 2012-07-25 NOTE — Telephone Encounter (Signed)
Faxed  INR order to [facility].  Called pt and informed her that order has been sent.

## 2012-07-26 ENCOUNTER — Other Ambulatory Visit: Payer: Self-pay | Admitting: Family Medicine

## 2012-07-26 LAB — PROTIME-INR: Prothrombin Time: 31.7 s — ABNORMAL HIGH (ref 9.1–12.0)

## 2012-08-01 NOTE — Telephone Encounter (Signed)
Do we have any other patients at [facility] that have the INR checked by the nurse there? Uncertain why she would not want the nurse to check her Coumadin and would prefer [facility]

## 2012-08-02 NOTE — Telephone Encounter (Signed)
We used to have a pt who's Coumadin was checked at [facility] by the RN.  We have told pt repeatedly in the past we will not monitor INR checks at [facility].  Pt has been advised she must come in for INR at our Coumadin clinic to be monitored by [place].  Uncertain why she prefers to continue to go to [facility].

## 2012-08-03 NOTE — Telephone Encounter (Signed)
We have a new coumadin clinic RN that is starting to manage our coumadin patients.  I will check with her re: comfort checking this pt's INR.  Pt states 7:30 am ([facility] coumadin clinic time) is too early for her.

## 2012-08-03 NOTE — Telephone Encounter (Signed)
Pt is refusing to have INR checked anywhere else besides [facility].  Is there anyone willing to manage her coumadin if having drawn at [facility]?

## 2012-08-10 ENCOUNTER — Telehealth: Payer: Self-pay | Admitting: *Deleted

## 2012-08-10 NOTE — Telephone Encounter (Signed)
Pt calls back and wants to know if the STAT orders have been sent to [facility] yet. Advised pt Dr Sharen Hones assistant will call pt back with answer; that it may not be today when she gets response.

## 2012-08-10 NOTE — Telephone Encounter (Signed)
And now patient has called me refusing to come here because it is too far away and wants STAT orders sent to [facility] for INR monitoring as before.

## 2012-08-10 NOTE — Telephone Encounter (Signed)
Rebecca Jennings from [facility] called and said patient is refusing to have her INR's done there because they only do them 2 days/week at 7:30 am. She says that is too early for her and she refuses to have them done there anymore. So, she is now wanting to go back to [facility] or come here at a time that is more convenient for her.

## 2012-08-11 NOTE — Telephone Encounter (Signed)
Spoke with patient - advised 2 options available - home monitoring her own INR or coming in to coumadin clinic for monitoring.  Discussed [facility] checking is not an option with [place].  Pt not happy with either option. Nothing resolved.

## 2012-08-11 NOTE — Telephone Encounter (Signed)
Spoke with Arline Asp - she states that her following [facility] results is not an option either.  See next note.

## 2012-08-22 ENCOUNTER — Other Ambulatory Visit: Payer: Self-pay | Admitting: General Practice

## 2012-08-22 ENCOUNTER — Ambulatory Visit (INDEPENDENT_AMBULATORY_CARE_PROVIDER_SITE_OTHER): Payer: Medicare Other | Admitting: General Practice

## 2012-08-22 DIAGNOSIS — I4891 Unspecified atrial fibrillation: Secondary | ICD-10-CM

## 2012-08-22 DIAGNOSIS — Z7901 Long term (current) use of anticoagulants: Secondary | ICD-10-CM | POA: Insufficient documentation

## 2012-08-22 MED ORDER — WARFARIN SODIUM 3 MG PO TABS: 3.0000 mg | ORAL_TABLET | Freq: Every day | ORAL | Status: DC

## 2012-08-22 NOTE — Telephone Encounter (Signed)
It looks like patient came in today to establish with our coumadin clinic.

## 2012-09-06 ENCOUNTER — Encounter: Payer: Self-pay | Admitting: Family Medicine

## 2012-09-12 ENCOUNTER — Ambulatory Visit (INDEPENDENT_AMBULATORY_CARE_PROVIDER_SITE_OTHER): Payer: Medicare Other | Admitting: General Practice

## 2012-09-12 DIAGNOSIS — I4891 Unspecified atrial fibrillation: Secondary | ICD-10-CM

## 2012-09-12 DIAGNOSIS — Z7901 Long term (current) use of anticoagulants: Secondary | ICD-10-CM

## 2012-09-12 LAB — POCT INR: INR: 3.3

## 2012-10-10 ENCOUNTER — Ambulatory Visit (INDEPENDENT_AMBULATORY_CARE_PROVIDER_SITE_OTHER): Payer: Medicare Other | Admitting: General Practice

## 2012-10-10 DIAGNOSIS — I4891 Unspecified atrial fibrillation: Secondary | ICD-10-CM

## 2012-10-10 DIAGNOSIS — Z7901 Long term (current) use of anticoagulants: Secondary | ICD-10-CM

## 2012-10-13 ENCOUNTER — Telehealth: Payer: Self-pay

## 2012-10-13 NOTE — Telephone Encounter (Signed)
Order written and faxed to [place] at [facility].

## 2012-10-13 NOTE — Telephone Encounter (Signed)
Ok to do. Thanks.  

## 2012-10-13 NOTE — Telephone Encounter (Signed)
Rebecca Jennings from [facility] request written order faxed to [phone] for PT/OT evaluation and treat. Diagnosis:decreased mobility and home modification.Please advise.

## 2012-11-02 ENCOUNTER — Encounter: Payer: Self-pay | Admitting: Cardiovascular Disease

## 2012-11-02 ENCOUNTER — Ambulatory Visit (INDEPENDENT_AMBULATORY_CARE_PROVIDER_SITE_OTHER): Payer: Medicare Other | Admitting: Cardiovascular Disease

## 2012-11-02 VITALS — BP 121/86 | HR 95 | Ht 60.5 in | Wt 109.2 lb

## 2012-11-02 DIAGNOSIS — R609 Edema, unspecified: Secondary | ICD-10-CM

## 2012-11-02 DIAGNOSIS — I10 Essential (primary) hypertension: Secondary | ICD-10-CM

## 2012-11-02 DIAGNOSIS — I4891 Unspecified atrial fibrillation: Secondary | ICD-10-CM

## 2012-11-02 NOTE — Progress Notes (Signed)
Patient ID: Rebecca Jennings, female    DOB: Jun 16, 1923, 79 y.o.   MRN: [id number]  HPI Comments: Rebecca Jennings is a very pleasant 79 year old woman with a history of chronic atrial fibrillation, on Coumadin with rate control, negative stress test in August 2009 with no known coronary artery disease who presents for routine followup,   H/o right mastectomy for breast cancer. Following the surgery, she had atrial fibrillation with RVR. She was given a dose of Cardizem and her rate improved.   She reports that she is doing well. She has been taking Lasix rarely. She continues to have significant lower extremity edema . She also takes HCTZ daily. Otherwise she feels well. She is unable to wear TED hose.  She denies any shortness of breath. She has been active. She has stopped driving.  She eats a significant amount thought despite this she has had difficulty maintaining weight. She uses a walker and a cane. Gait is very slow and shuffled. She also has arthritis that bothers her particularly in the morning.   EKG shows atrial fibrillation with ventricular rate 95  beats per minute, no significant ST or T wave changes   Outpatient Encounter Prescriptions as of 11/02/2012  Medication Sig Dispense Refill  . alendronate (FOSAMAX) 70 MG tablet Take 70 mg by mouth every 7 (seven) days. Take with a full glass of water on an empty stomach.      Marland Kitchen aspirin 81 MG tablet Take 81 mg by mouth daily.        . bisacodyl (DULCOLAX) 5 MG EC tablet Take 5 mg by mouth daily as needed.      . Calcium Carbonate-Vitamin D (CALTRATE 600+D) 600-400 MG-UNIT per tablet Take 1 tablet by mouth 2 (two) times daily.      . cetaphil (CETAPHIL) cream Apply topically as needed.      . DESONIDE EX Apply topically.      . ENSURE (ENSURE) Take 237 mLs by mouth daily.      . furosemide (LASIX) 20 MG tablet Take 20 mg by mouth daily.       . hydrochlorothiazide (HYDRODIURIL) 25 MG tablet Take 1 tablet (25 mg total) by mouth daily.  90  tablet  3  . letrozole (FEMARA) 2.5 MG tablet Take 2.5 mg by mouth daily.        . metoprolol (LOPRESSOR) 50 MG tablet Take one and one-half tablet twice a day.  270 tablet  3  . triamcinolone cream (KENALOG) 0.1 % Apply topically 2 (two) times daily.      Marland Kitchen warfarin (COUMADIN) 3 MG tablet Take 1 tablet (3 mg total) by mouth daily. Take as directed by coumadin clinic.  30 tablet  3   No facility-administered encounter medications on file as of 11/02/2012.     Review of Systems  Constitutional: Negative.   HENT: Negative.   Eyes: Negative.   Respiratory: Negative.   Cardiovascular: Positive for leg swelling.  Gastrointestinal: Negative.   Musculoskeletal: Positive for gait problem.  Skin: Negative.   Neurological: Negative.   Psychiatric/Behavioral: Negative.   All other systems reviewed and are negative.    BP 121/86  Pulse 95  Ht 5' 0.5" (1.537 m)  Wt 109 lb 4 oz (49.555 kg)  BMI 20.98 kg/m2  Physical Exam  Nursing note and vitals reviewed. Constitutional: She is oriented to person, place, and time. She appears well-developed and well-nourished.  HENT:  Head: Normocephalic.  Nose: Nose normal.  Mouth/Throat: Oropharynx is clear and  moist.  Eyes: Conjunctivae are normal. Pupils are equal, round, and reactive to light.  Neck: Normal range of motion. Neck supple. No JVD present.  Cardiovascular: Normal rate, S1 normal, S2 normal and intact distal pulses.  An irregularly irregular rhythm present. Exam reveals no gallop and no friction rub.   Murmur heard.  Crescendo systolic murmur is present with a grade of 2/6   1+ edema in the legs that is pitting  Pulmonary/Chest: Effort normal and breath sounds normal. No respiratory distress. She has no wheezes. She has no rales. She exhibits no tenderness.  Abdominal: Soft. Bowel sounds are normal. She exhibits no distension. There is no tenderness.  Musculoskeletal: Normal range of motion. She exhibits no edema and no tenderness.   Lymphadenopathy:    She has no cervical adenopathy.  Neurological: She is alert and oriented to person, place, and time. Coordination normal.  Skin: Skin is warm and dry. No rash noted. No erythema.  Psychiatric: She has a normal mood and affect. Her behavior is normal. Judgment and thought content normal.    Assessment and Plan

## 2012-11-02 NOTE — Assessment & Plan Note (Signed)
Edema concerning for diastolic CHF, chronic. She's not taking Lasix, only HCTZ. We have suggested she take Lasix 2 times per week with a banana.

## 2012-11-02 NOTE — Assessment & Plan Note (Signed)
Heart rate is mildly elevated. She's otherwise asymptomatic. No changes to her medication. Continue warfarin. No recent falls.

## 2012-11-02 NOTE — Assessment & Plan Note (Signed)
Blood pressure is well controlled on today's visit. No changes made to the medications. 

## 2012-11-02 NOTE — Patient Instructions (Addendum)
You are doing well. No medication changes were made.  Continue to take lasix as much as tolerated for leg swelling (try twice per week)  Please call us if you have new issues that need to be addressed before your next appt.  Your physician wants you to follow-up in: 6 months.  You will receive a reminder letter in the mail two months in advance. If you don't receive a letter, please call our office to schedule the follow-up appointment.

## 2012-11-07 ENCOUNTER — Ambulatory Visit (INDEPENDENT_AMBULATORY_CARE_PROVIDER_SITE_OTHER): Payer: Medicare Other | Admitting: General Practice

## 2012-11-07 DIAGNOSIS — Z7901 Long term (current) use of anticoagulants: Secondary | ICD-10-CM

## 2012-11-07 DIAGNOSIS — I4891 Unspecified atrial fibrillation: Secondary | ICD-10-CM

## 2012-11-20 ENCOUNTER — Encounter: Payer: Self-pay | Admitting: Family Medicine

## 2012-12-05 ENCOUNTER — Ambulatory Visit (INDEPENDENT_AMBULATORY_CARE_PROVIDER_SITE_OTHER): Payer: Medicare Other | Admitting: General Practice

## 2012-12-05 DIAGNOSIS — I4891 Unspecified atrial fibrillation: Secondary | ICD-10-CM

## 2012-12-05 DIAGNOSIS — Z7901 Long term (current) use of anticoagulants: Secondary | ICD-10-CM

## 2012-12-05 LAB — POCT INR: INR: 2.8

## 2013-01-02 ENCOUNTER — Ambulatory Visit (INDEPENDENT_AMBULATORY_CARE_PROVIDER_SITE_OTHER): Payer: Medicare Other | Admitting: General Practice

## 2013-01-02 DIAGNOSIS — Z7901 Long term (current) use of anticoagulants: Secondary | ICD-10-CM

## 2013-01-02 DIAGNOSIS — I4891 Unspecified atrial fibrillation: Secondary | ICD-10-CM

## 2013-01-02 LAB — POCT INR: INR: 1.8

## 2013-01-30 ENCOUNTER — Ambulatory Visit (INDEPENDENT_AMBULATORY_CARE_PROVIDER_SITE_OTHER): Payer: Medicare Other | Admitting: Family Medicine

## 2013-01-30 DIAGNOSIS — I4891 Unspecified atrial fibrillation: Secondary | ICD-10-CM

## 2013-01-30 DIAGNOSIS — Z7901 Long term (current) use of anticoagulants: Secondary | ICD-10-CM

## 2013-01-30 LAB — POCT INR: INR: 1.5

## 2013-02-14 ENCOUNTER — Telehealth: Payer: Self-pay | Admitting: *Deleted

## 2013-02-14 NOTE — Telephone Encounter (Signed)
fyi

## 2013-02-14 NOTE — Telephone Encounter (Signed)
Patient having problem with left leg w/ fluid build up and lots of moisture

## 2013-02-14 NOTE — Telephone Encounter (Signed)
Pt reports LE edema and "weeping" of clear fluid from leg. She has not been taking furosemide as we instructed her at last OV She has not taken this at all I advised he take this QD x 2 days and see if this helps edema I will call her back in 2 days to reassess understanding verb

## 2013-02-15 NOTE — Telephone Encounter (Signed)
She will need to take lasix for a few days in a row until better, then maybe every other day for a while

## 2013-02-16 ENCOUNTER — Telehealth: Payer: Self-pay

## 2013-02-16 NOTE — Telephone Encounter (Signed)
Pt informed She will continue diuretic a few more days and I will call to reassess Monday 6/30

## 2013-02-16 NOTE — Telephone Encounter (Signed)
Duplicate note

## 2013-02-16 NOTE — Telephone Encounter (Signed)
Assess edema

## 2013-02-16 NOTE — Telephone Encounter (Signed)
No answer

## 2013-02-20 ENCOUNTER — Ambulatory Visit (INDEPENDENT_AMBULATORY_CARE_PROVIDER_SITE_OTHER): Payer: Medicare Other | Admitting: Family Medicine

## 2013-02-20 ENCOUNTER — Telehealth: Payer: Self-pay

## 2013-02-20 DIAGNOSIS — Z7901 Long term (current) use of anticoagulants: Secondary | ICD-10-CM

## 2013-02-20 DIAGNOSIS — I4891 Unspecified atrial fibrillation: Secondary | ICD-10-CM

## 2013-02-20 LAB — POCT INR: INR: 1.9

## 2013-02-20 NOTE — Telephone Encounter (Signed)
Assess symptoms

## 2013-02-20 NOTE — Telephone Encounter (Signed)
Pt reports left leg weeping has not improved She has been taking lasix QD with no improvement She went to PCP today for INR check but did not mention leg issue to them I will discuss with Dr. Mariah Milling to see what he would have me do and call her back Understanding verb

## 2013-02-20 NOTE — Telephone Encounter (Signed)
Would have her come in Tuesday am or Wednesday AM to be evaluated

## 2013-02-21 ENCOUNTER — Encounter: Payer: Self-pay | Admitting: *Deleted

## 2013-02-21 NOTE — Telephone Encounter (Signed)
I attempted to reach pt Rebecca Jennings today No answer Will try again later

## 2013-02-22 NOTE — Telephone Encounter (Signed)
No answer

## 2013-02-28 ENCOUNTER — Telehealth: Payer: Self-pay

## 2013-02-28 NOTE — Telephone Encounter (Signed)
Rebecca Jennings at [facility] left v/m; staff concerned about pt being more forgetful and confused. Pt having difficulty finding correct words and question if taking medication correctly. Lt lower leg is swollen,weeping and red; pt is not wrapping leg appropriately. Pt told staff she is following her condition closely with Dr Sharen Hones and Angelica Chessman questions that. Rebecca Jennings request call back today.Please advise.

## 2013-02-28 NOTE — Telephone Encounter (Signed)
plz call to f/u. I have not seen patient since 06/2012, I was not aware of these changes.  She has been in touch with cardiology regarding this. I recommend appt with me to evaluate.

## 2013-02-28 NOTE — Telephone Encounter (Signed)
Left message on voice mail  to call back

## 2013-02-28 NOTE — Telephone Encounter (Signed)
Appt scheduled for 7/24, pt stated she is busy and cant come in before then.  Left message on Mandy's voice mail advising her of this.

## 2013-03-01 NOTE — Telephone Encounter (Signed)
It appears PCP is managing this See telephone notes

## 2013-03-03 NOTE — Telephone Encounter (Signed)
Spoke with Mrs Sundquist - she states leg has healed up.  She will keep appointment on 03/16/2013, declines sooner appointment.  She thought there was an outstanding bill so she mailed me a letter with her credit card info.  I advised it was not safe to send sensitive info via mail like this  Touched base with Angelica Chessman - more dfificulty following Mrs. Kotch's train of thought recently.

## 2013-03-13 ENCOUNTER — Ambulatory Visit (INDEPENDENT_AMBULATORY_CARE_PROVIDER_SITE_OTHER): Payer: Medicare Other | Admitting: Family Medicine

## 2013-03-13 DIAGNOSIS — I4891 Unspecified atrial fibrillation: Secondary | ICD-10-CM

## 2013-03-13 DIAGNOSIS — Z7901 Long term (current) use of anticoagulants: Secondary | ICD-10-CM

## 2013-03-14 ENCOUNTER — Other Ambulatory Visit: Payer: Self-pay | Admitting: Family Medicine

## 2013-03-16 ENCOUNTER — Encounter: Payer: Self-pay | Admitting: Family Medicine

## 2013-03-16 ENCOUNTER — Ambulatory Visit (INDEPENDENT_AMBULATORY_CARE_PROVIDER_SITE_OTHER): Payer: Medicare Other | Admitting: Family Medicine

## 2013-03-16 ENCOUNTER — Telehealth: Payer: Self-pay | Admitting: Family Medicine

## 2013-03-16 ENCOUNTER — Ambulatory Visit: Payer: Medicare Other | Admitting: Family Medicine

## 2013-03-16 ENCOUNTER — Telehealth: Payer: Self-pay

## 2013-03-16 VITALS — BP 124/82 | HR 68 | Temp 98.3°F | Wt 107.8 lb

## 2013-03-16 DIAGNOSIS — R413 Other amnesia: Secondary | ICD-10-CM

## 2013-03-16 DIAGNOSIS — R609 Edema, unspecified: Secondary | ICD-10-CM

## 2013-03-16 DIAGNOSIS — I10 Essential (primary) hypertension: Secondary | ICD-10-CM

## 2013-03-16 LAB — COMPREHENSIVE METABOLIC PANEL
AST: 36 U/L (ref 0–37)
Albumin: 3.7 g/dL (ref 3.5–5.2)
Alkaline Phosphatase: 80 U/L (ref 39–117)
BUN: 32 mg/dL — ABNORMAL HIGH (ref 6–23)
Glucose, Bld: 98 mg/dL (ref 70–99)
Potassium: 3.7 mEq/L (ref 3.5–5.1)
Total Bilirubin: 0.5 mg/dL (ref 0.3–1.2)

## 2013-03-16 LAB — TSH: TSH: 1.06 u[IU]/mL (ref 0.35–5.50)

## 2013-03-16 LAB — CBC WITH DIFFERENTIAL/PLATELET
Basophils Relative: 0.7 % (ref 0.0–3.0)
Eosinophils Relative: 1.2 % (ref 0.0–5.0)
HCT: 30.8 % — ABNORMAL LOW (ref 36.0–46.0)
Hemoglobin: 10 g/dL — ABNORMAL LOW (ref 12.0–15.0)
Lymphs Abs: 1.3 10*3/uL (ref 0.7–4.0)
MCV: 94.5 fl (ref 78.0–100.0)
Monocytes Absolute: 0.6 10*3/uL (ref 0.1–1.0)
Monocytes Relative: 8 % (ref 3.0–12.0)
Neutro Abs: 5.8 10*3/uL (ref 1.4–7.7)
RBC: 3.26 Mil/uL — ABNORMAL LOW (ref 3.87–5.11)
WBC: 7.9 10*3/uL (ref 4.5–10.5)

## 2013-03-16 LAB — VITAMIN B12: Vitamin B-12: 458 pg/mL (ref 211–911)

## 2013-03-16 NOTE — Telephone Encounter (Signed)
Will forward this to Dr. Sol Passer RN

## 2013-03-16 NOTE — Assessment & Plan Note (Addendum)
With ulcer present. Has not been taking lasix regularly. I advised her to restart lasix 20mg  daily as this will help pedal edema. Anticipate edema related to CHF and CVI, but cannot fully rule out PAD so did not place in unna boot today. I anticipate diuresis will facilitate wound healing. I also recommended triple abx ointment for ulcer.

## 2013-03-16 NOTE — Telephone Encounter (Signed)
Spoke with son, discussed memory concerns. Will start with blood work, consider head imaging study.

## 2013-03-16 NOTE — Assessment & Plan Note (Addendum)
Concern for this with MMSE of 13/30 - hearing deficit contributing some, but doesn't account for significant impairment noted today. Suspect vascular dementia or lewy body dementia. Pt denies this is a problem.  Would strongly consider head CT for further evaluation (as pt is on coumadin), but pt does not want further evaluation today "I have too much going on right now". I also recommended basic dementia workup with TSH, B12, lytes and folate - checked today. I will touch base with son - I obtained pt's permission to call son.

## 2013-03-16 NOTE — Progress Notes (Signed)
  Subjective:    Patient ID: Rebecca Jennings, female    DOB: [birth year], 79 y.o.   MRN: [id number]  HPI CC: confusion, leg swelling  [name] presents alone today 2/2 ongoing issue with leg swelling for last several months, as well as noted confusion by staff at [facility].  See recent phone notes.  Not seen by myself since she established care 06/2012.  She had difficulty collecting urine today for urinalysis - unable to complete task.  When asked, states "I couldn't make urine"  States legs are improved.    States Dr. Mariah Milling told her to take furosemide as needed, doesn't take daily.  Lives alone at [facility] since 2011 Occupation: retired Edu: college  Son lives 1 hr away. Has personal aide Steve Rattler) who comes to help out twice a week.  Completed PT/OT 10/2012 for eval of decreased mobility and home modifications  Denies chest pain, coughing, fevers, abd pain, dysuria.  Mental Status Exam: (value/max value) 13/30.  Poor effort.  Becomes agitated when doesn't know answers.  Hearing deficit also noted - left hearing aides at home.   Past Medical History  Diagnosis Date  . Atrial fibrillation     ([place])  . Osteoporosis     DEXA 08/2011 T -3.3  . HTN (hypertension)   . History of breast cancer 2010    R adenocarcinoma s/p lumpectomy with axillary dissection and XRT, stage T1cN0M0 grade 2, ER+(90%),PR-,HER2- ([place])  . Stasis dermatitis      Review of Systems Per HPI    Objective:   Physical Exam  Nursing note and vitals reviewed. Constitutional: She appears well-developed and well-nourished. No distress.  HENT:  Head: Normocephalic and atraumatic.  Mouth/Throat: Oropharynx is clear and moist. No oropharyngeal exudate.  Cardiovascular: Normal rate, regular rhythm, normal heart sounds and intact distal pulses.   No murmur heard. Pulmonary/Chest: Effort normal and breath sounds normal. No respiratory distress. She has no wheezes. She has  no rales.  Musculoskeletal: She exhibits edema (1+ pitting edema bilaterally).  Diminished DP/PT bilaterally  Skin:  Clean based shallow abrasions on left anterior legs, granulation tissue present  Psychiatric: She has a normal mood and affect. Her speech is normal and behavior is normal. Judgment and thought content normal. Cognition and memory are impaired. She exhibits normal recent memory and normal remote memory.       Assessment & Plan:

## 2013-03-16 NOTE — Patient Instructions (Addendum)
Good to see you today. I'm worried about some memory trouble. Use triple antibiotic ointment to left leg wound.   Take furosemide 20mg  daily - every day in the morning.  This will help wound heal better. Blood work today. I'm going to touch base with your son (at [phone] home or [phone] cell). Return to see me in 1-2 months for follow up - come with your son if able.

## 2013-03-16 NOTE — Telephone Encounter (Signed)
Pt states she saw dr Sharen Hones today, and "he is a sharp guy, but I am not interested in seeing him again". Just wanted dr. Mariah Milling to know

## 2013-03-16 NOTE — Telephone Encounter (Signed)
Would she like to see one of the other doctors there? Several female physician work there

## 2013-03-17 ENCOUNTER — Encounter: Payer: Self-pay | Admitting: *Deleted

## 2013-03-17 NOTE — Telephone Encounter (Signed)
I spoke with pt today. She was not dissatisfied with her appointment yesterday with pcp. She was concerned that her appointment went too long & that Dr. Sharen Hones asked "so many questions".  This was her first ov. Explained to pt that it was necessary to be aquainted with pt & her history in order to help her. Pt verbalizations seemed out of sync with the conversation at times.  Reminded her of her September appointment with pcp. " I certainly hope it won't go so long" Pt son possibly accompanying her again at next visit. Mylo Red RN

## 2013-03-21 ENCOUNTER — Telehealth: Payer: Self-pay

## 2013-03-21 NOTE — Telephone Encounter (Signed)
If patient will agree to this - ok to do verbal order for physical therapy.   Pt refused this at office visit, but is a wound care nurse available at [facility]?

## 2013-03-21 NOTE — Telephone Encounter (Signed)
Message left for patient to return my call.  

## 2013-03-21 NOTE — Telephone Encounter (Signed)
Correction on previous note-message was left for KATIE to return my call, which she did and left me a message. I returned her call and left her a message. Will await return call.

## 2013-03-21 NOTE — Telephone Encounter (Signed)
Katie nurse at [facility] independent living saw pt on 03/20/13; pt has trouble getting out of bed with weakness and difficulty pulling herself up to get out of bed;Katie request order for hospital bed and pt has ulcers on lt leg and legs are weeping; Florentina Addison said this is an ongoing issue and request order for PT. Katie request cb.

## 2013-03-21 NOTE — Telephone Encounter (Signed)
Rebecca Jennings [facility] left v/m requesting cb. No further message. Left v/m for Rebecca Jennings to cb.

## 2013-03-22 NOTE — Telephone Encounter (Signed)
Rebecca Jennings left v/m requesting cb; left v.m for Rebecca Jennings to cb.

## 2013-03-22 NOTE — Telephone Encounter (Signed)
I'm sorry.  Ok to do verbal order for hospital bed.

## 2013-03-22 NOTE — Telephone Encounter (Signed)
I called [facility] back;Katie out of facility this afternoon and other 2 nurses are with pts. Noreene Larsson will have nurse cb.

## 2013-03-22 NOTE — Telephone Encounter (Signed)
Katie said pt did agree for PT as long as PT would come to her residence; Florentina Addison said PT will go to pt's residence;verbal order was given for physical therapy. Katie said a wound care nurse is available at [facility] for an additional charge. Florentina Addison also said pt is having problems getting out of bed due to weakness and difficulty pulling herself up to get out of bed; request order for hospital bed.Please advise.

## 2013-03-27 NOTE — Telephone Encounter (Signed)
Rebecca Jennings with [facility] left v/m requesting cb. Left v/m requesting Rebecca Jennings to cb.

## 2013-03-27 NOTE — Telephone Encounter (Signed)
Katie left v/m requesting cb; left v/m requesting Katie cb.

## 2013-03-28 NOTE — Telephone Encounter (Signed)
Form faxed to Florentina Addison at [facility] as directed.

## 2013-03-28 NOTE — Telephone Encounter (Signed)
Filled out form and placed in Kim's box.  plz fax back today.

## 2013-03-30 ENCOUNTER — Telehealth: Payer: Self-pay | Admitting: *Deleted

## 2013-03-30 NOTE — Telephone Encounter (Signed)
Eunice Blase, CSW from [facility] called and said she was trying to get hospital bed for patient and according to Medicare guidelines, this required a face-to-face visit to discuss this. She was also asking for a copy of the last office note. Is this something I can send her? She is aware that the patient will need another appt to discuss the bed. Fax to [phone] attn: Eunice Blase CSW

## 2013-03-31 NOTE — Telephone Encounter (Signed)
plz fax last office visit.

## 2013-03-31 NOTE — Telephone Encounter (Signed)
Faxed as directed. 

## 2013-04-10 ENCOUNTER — Ambulatory Visit (INDEPENDENT_AMBULATORY_CARE_PROVIDER_SITE_OTHER): Payer: Medicare Other | Admitting: Family Medicine

## 2013-04-10 DIAGNOSIS — I4891 Unspecified atrial fibrillation: Secondary | ICD-10-CM

## 2013-04-10 DIAGNOSIS — Z7901 Long term (current) use of anticoagulants: Secondary | ICD-10-CM

## 2013-04-18 ENCOUNTER — Ambulatory Visit (INDEPENDENT_AMBULATORY_CARE_PROVIDER_SITE_OTHER): Payer: Medicare Other | Admitting: Family Medicine

## 2013-04-18 ENCOUNTER — Encounter: Payer: Self-pay | Admitting: Family Medicine

## 2013-04-18 VITALS — BP 118/80 | HR 94 | Temp 98.3°F | Wt 107.0 lb

## 2013-04-18 DIAGNOSIS — R609 Edema, unspecified: Secondary | ICD-10-CM

## 2013-04-18 DIAGNOSIS — R413 Other amnesia: Secondary | ICD-10-CM

## 2013-04-18 DIAGNOSIS — I4891 Unspecified atrial fibrillation: Secondary | ICD-10-CM

## 2013-04-18 DIAGNOSIS — D649 Anemia, unspecified: Secondary | ICD-10-CM | POA: Insufficient documentation

## 2013-04-18 DIAGNOSIS — I8310 Varicose veins of unspecified lower extremity with inflammation: Secondary | ICD-10-CM

## 2013-04-18 DIAGNOSIS — R600 Localized edema: Secondary | ICD-10-CM

## 2013-04-18 NOTE — Assessment & Plan Note (Signed)
Discussed dx from recent blood work. No noted blood loss. Anticipate IDA.  Colonoscopy done 2009 and normal per pt  Pt declines further evaluation of anemia. I have provided her with a iron rich diet handout per patient request.

## 2013-04-18 NOTE — Assessment & Plan Note (Signed)
Stable and compliant on coumadin. Lab Results  Component Value Date   INR 3.5 04/10/2013   INR 1.9 03/13/2013   INR 1.9 02/20/2013

## 2013-04-18 NOTE — Patient Instructions (Addendum)
Return to see me in 1-2 months for follow up - bring your son to that visit if able. List of iron rich foods provided today. For leg swelling - I'd recommend continuing lasix, elevating legs, and avoiding salt.  I have given you a prescription to take to durable equipment store.  May use these during the day, take off at night. Good to see you today, call us with questions.

## 2013-04-18 NOTE — Assessment & Plan Note (Signed)
She continues triamcinolone cream per derm.

## 2013-04-18 NOTE — Progress Notes (Signed)
  Subjective:    Patient ID: Rebecca Jennings, female    DOB: [birth year], 79 y.o.   MRN: [id number]  HPI CC: 1 mo f/u  Mrs . Jennings presents alone today as 1 mo follow up from prior visit.  Anemia - found on recent blood work.  Discussed.  Pt declines further evaluation.  Leg swelling - has not been taking lasix 2/2 increased urination.  Continued leg swelling.  Ulcers have healed  Continues to deny memory impairment.  Declines hospital bed.  States she is doing well with Nell her caregiver at home  Lives at [facility]. Hesitant to see RN 2/2 bad experiences in the past.  Past Medical History  Diagnosis Date  . Atrial fibrillation     ([place])  . Osteoporosis     DEXA 08/2011 T -3.3  . HTN (hypertension)   . History of breast cancer 2010    R adenocarcinoma s/p lumpectomy with axillary dissection and XRT, stage T1cN0M0 grade 2, ER+(90%),PR-,HER2- ([place])  . Stasis dermatitis      Review of Systems Per HPI    Objective:   Physical Exam  Nursing note and vitals reviewed. Constitutional: She appears well-developed and well-nourished. No distress.  HENT:  Mouth/Throat: Oropharynx is clear and moist. No oropharyngeal exudate.  Cardiovascular: Normal rate, normal heart sounds and intact distal pulses.  An irregularly irregular rhythm present.  No murmur heard. Pulmonary/Chest: Effort normal and breath sounds normal. No respiratory distress. She has no wheezes. She has no rales.  No crackles  Musculoskeletal: She exhibits edema (2+ pitting edema to knees).  Diminished but present pedal pulses  Skin: Skin is warm and dry. No rash noted.       Assessment & Plan:

## 2013-04-18 NOTE — Assessment & Plan Note (Signed)
Persistent but improved as ulcer has healed. Does not take lasix regularly 2/2 increased urinary frequency. Advised to cut lasix in 1/2 and take daily. Also again reviewed elevation of legs as well as avoidance of sodium in diet. I will also prescribe compression stockings - I gave her a prescription for this and she will have Nell take her to DME store for fitting. Anticipate CVI vs CHF related, however no other evidence of fluid overload or CHF. I've asked her to return in 6 weeks for recheck.

## 2013-04-18 NOTE — Assessment & Plan Note (Addendum)
Continued concern for early dementia.  Pt continues to deny memory impairment. Overall stable compared to last visit.  Does repeat questions and has some trouble understanding complicated concepts.  Will continue to monitor. Pt again has given me permission to contact son by phone anytime. I will continue to encourage her to bring her son to a future appointment.

## 2013-05-08 ENCOUNTER — Ambulatory Visit (INDEPENDENT_AMBULATORY_CARE_PROVIDER_SITE_OTHER): Payer: Medicare Other | Admitting: Family Medicine

## 2013-05-08 DIAGNOSIS — I4891 Unspecified atrial fibrillation: Secondary | ICD-10-CM

## 2013-05-08 DIAGNOSIS — Z7901 Long term (current) use of anticoagulants: Secondary | ICD-10-CM

## 2013-05-08 LAB — POCT INR: INR: 4.8

## 2013-05-09 ENCOUNTER — Telehealth: Payer: Self-pay | Admitting: *Deleted

## 2013-05-09 MED ORDER — HYDROCHLOROTHIAZIDE 25 MG PO TABS: 25.0000 mg | ORAL_TABLET | Freq: Every day | ORAL | Status: DC

## 2013-05-09 NOTE — Telephone Encounter (Signed)
Spoke with pt concerning refill request for Hctz. Pt was asked what medications she needs refilled, 30 day/90 day and which pharmacy. Pt stated HCTZ 30 day sent to [facility] pharmacy. Pt is due for 6 month f/u this month scheduled on 05/22/13.

## 2013-05-17 ENCOUNTER — Telehealth: Payer: Self-pay | Admitting: Family Medicine

## 2013-05-17 NOTE — Telephone Encounter (Signed)
Patient lives at [facility] in [place].  Patient is asking to switch from Dr.Gutierrez to Dr.Aron.  Patient said she heard Dr.Aron is nice and would like to switch.  Please advise.  Patient said she won't go back to Dr.Gutierrez.  I let her know she probably wouldn't receive a response until next week since Dr. Sharen Hones is off.

## 2013-05-18 ENCOUNTER — Ambulatory Visit: Payer: Medicare Other | Admitting: Family Medicine

## 2013-05-22 ENCOUNTER — Encounter: Payer: Self-pay | Admitting: Cardiovascular Disease

## 2013-05-22 ENCOUNTER — Ambulatory Visit (INDEPENDENT_AMBULATORY_CARE_PROVIDER_SITE_OTHER): Payer: Medicare Other | Admitting: Cardiovascular Disease

## 2013-05-22 VITALS — BP 100/72 | HR 113 | Ht 60.0 in | Wt 109.2 lb

## 2013-05-22 DIAGNOSIS — R609 Edema, unspecified: Secondary | ICD-10-CM

## 2013-05-22 DIAGNOSIS — R600 Localized edema: Secondary | ICD-10-CM | POA: Insufficient documentation

## 2013-05-22 DIAGNOSIS — I10 Essential (primary) hypertension: Secondary | ICD-10-CM

## 2013-05-22 DIAGNOSIS — Z7901 Long term (current) use of anticoagulants: Secondary | ICD-10-CM

## 2013-05-22 DIAGNOSIS — I4891 Unspecified atrial fibrillation: Secondary | ICD-10-CM

## 2013-05-22 NOTE — Assessment & Plan Note (Signed)
Heart rate elevated on EKG, improved on clinical exam. Unable to advance her beta blocker given her low blood pressure. We'll continue metoprolol with warfarin.

## 2013-05-22 NOTE — Assessment & Plan Note (Addendum)
Possible contribution from diastolic CHF, secondary to arrhythmia/atrial fibrillation. We have encouraged her to take Lasix every other day, Monday, Wednesday, Friday and Saturday. She will continue on HCTZ. Basic metabolic panel in followup

## 2013-05-22 NOTE — Patient Instructions (Addendum)
For your leg swelling, Please take lasix every other day on Monday, Wednesday, Friday and Saturday  Please call us if you have new issues that need to be addressed before your next appt.  Your physician wants you to follow-up in: 3 months.  You will receive a reminder letter in the mail two months in advance. If you don't receive a letter, please call our office to schedule the follow-up appointment.

## 2013-05-22 NOTE — Assessment & Plan Note (Signed)
Currently on warfarin for atrial fibrillation. No recent falls. Managed by the Coumadin clinic

## 2013-05-22 NOTE — Progress Notes (Signed)
Patient ID: Rebecca Jennings, female    DOB: 1922-11-11, 79 y.o.   MRN: [id number]  HPI Comments: Rebecca Jennings is a very pleasant 79 year old woman with a history of chronic atrial fibrillation, on Coumadin with rate control, negative stress test in August 2009 with no known coronary artery disease who presents for routine followup,   H/o right mastectomy for breast cancer. Following the surgery, she had atrial fibrillation with RVR. She was given a dose of Cardizem and her rate improved.   She reports that she is doing well. She has been taking Lasix rarely. She continues to have significant lower extremity edema . She  takes HCTZ daily.  Otherwise she feels well. She is unable to wear TED hose. Little has changed from her prior clinic visit. She does report taking Lasix 2 times per week. She denies any lightheadedness or dizziness.   She denies any shortness of breath. She eats a significant amount thought despite this she has had difficulty maintaining weight. She uses a walker and a cane. Gait is very slow and shuffled. Continues to live at [facility] [place]. Son visits and provides assistance   EKG shows atrial fibrillation with ventricular rate 113  beats per minute, no significant ST or T wave changes. LVH by voltage criteria   Outpatient Encounter Prescriptions as of 05/22/2013  Medication Sig Dispense Refill  . alendronate (FOSAMAX) 70 MG tablet Take 70 mg by mouth every 7 (seven) days. Take with a full glass of water on an empty stomach. As needed.      Marland Kitchen aspirin 81 MG tablet Take 81 mg by mouth daily.        . bisacodyl (DULCOLAX) 5 MG EC tablet Take 5 mg by mouth daily as needed.      . Calcium Carbonate-Vitamin D (CALTRATE 600+D) 600-400 MG-UNIT per tablet Take 1 tablet by mouth 2 (two) times daily.      Marland Kitchen ENSURE (ENSURE) Take 237 mLs by mouth daily.      . furosemide (LASIX) 20 MG tablet Take 20 mg by mouth 2 (two) times a week. As needed.      . hydrochlorothiazide (HYDRODIURIL) 25 MG  tablet Take 1 tablet (25 mg total) by mouth daily.  30 tablet  3  . letrozole (FEMARA) 2.5 MG tablet Take 2.5 mg by mouth daily.        . metoprolol (LOPRESSOR) 50 MG tablet Take one and one-half tablet twice a day.  270 tablet  3  . triamcinolone cream (KENALOG) 0.1 % Apply topically 2 (two) times daily.      Marland Kitchen warfarin (COUMADIN) 3 MG tablet Take as directed by [facility].  40 tablet  2   No facility-administered encounter medications on file as of 05/22/2013.     Review of Systems  Constitutional: Negative.   HENT: Negative.   Eyes: Negative.   Respiratory: Negative.   Cardiovascular: Positive for leg swelling.  Gastrointestinal: Negative.   Endocrine: Negative.   Musculoskeletal: Positive for gait problem.  Skin: Negative.   Allergic/Immunologic: Negative.   Neurological: Negative.   Hematological: Negative.   Psychiatric/Behavioral: Negative.   All other systems reviewed and are negative.    BP 100/72  Pulse 113  Ht 5' (1.524 m)  Wt 109 lb 4 oz (49.555 kg)  BMI 21.34 kg/m2  Physical Exam  Nursing note and vitals reviewed. Constitutional: She is oriented to person, place, and time. She appears well-developed and well-nourished.  HENT:  Head: Normocephalic.  Nose: Nose normal.  Mouth/Throat: Oropharynx is clear and moist.  Eyes: Conjunctivae are normal. Pupils are equal, round, and reactive to light.  Neck: Normal range of motion. Neck supple. No JVD present.  Cardiovascular: Normal rate, S1 normal, S2 normal and intact distal pulses.  An irregularly irregular rhythm present. Exam reveals no gallop and no friction rub.   Murmur heard.  Crescendo systolic murmur is present with a grade of 2/6   1+ to 2+ edema in the legs that is pitting  Pulmonary/Chest: Effort normal and breath sounds normal. No respiratory distress. She has no wheezes. She has no rales. She exhibits no tenderness.  Abdominal: Soft. Bowel sounds are normal. She exhibits no distension. There is  no tenderness.  Musculoskeletal: Normal range of motion. She exhibits no edema and no tenderness.  Lymphadenopathy:    She has no cervical adenopathy.  Neurological: She is alert and oriented to person, place, and time. Coordination normal.  Skin: Skin is warm and dry. No rash noted. No erythema.  Psychiatric: She has a normal mood and affect. Her behavior is normal. Judgment and thought content normal.    Assessment and Plan

## 2013-05-22 NOTE — Assessment & Plan Note (Signed)
Blood pressures running low. Unable to advance her metoprolol. She denies any dizziness. Continue current medications

## 2013-05-29 ENCOUNTER — Ambulatory Visit: Payer: Medicare Other

## 2013-06-01 ENCOUNTER — Ambulatory Visit (INDEPENDENT_AMBULATORY_CARE_PROVIDER_SITE_OTHER): Payer: Medicare Other | Admitting: Family Medicine

## 2013-06-01 DIAGNOSIS — Z7901 Long term (current) use of anticoagulants: Secondary | ICD-10-CM

## 2013-06-01 DIAGNOSIS — I4891 Unspecified atrial fibrillation: Secondary | ICD-10-CM

## 2013-06-01 LAB — POCT INR: INR: 2

## 2013-06-05 ENCOUNTER — Ambulatory Visit: Payer: Medicare Other

## 2013-06-08 ENCOUNTER — Other Ambulatory Visit: Payer: Self-pay | Admitting: Family Medicine

## 2013-06-08 NOTE — Telephone Encounter (Signed)
Ok to refill 

## 2013-06-29 ENCOUNTER — Ambulatory Visit (INDEPENDENT_AMBULATORY_CARE_PROVIDER_SITE_OTHER): Payer: Medicare Other | Admitting: Family Medicine

## 2013-06-29 DIAGNOSIS — Z7901 Long term (current) use of anticoagulants: Secondary | ICD-10-CM

## 2013-06-29 DIAGNOSIS — I4891 Unspecified atrial fibrillation: Secondary | ICD-10-CM

## 2013-06-29 LAB — POCT INR: INR: 4.2

## 2013-07-12 ENCOUNTER — Other Ambulatory Visit: Payer: Self-pay | Admitting: *Deleted

## 2013-07-12 MED ORDER — METOPROLOL TARTRATE 50 MG PO TABS: ORAL_TABLET | ORAL | Status: AC

## 2013-07-12 NOTE — Telephone Encounter (Signed)
Requested Prescriptions   Signed Prescriptions Disp Refills  . metoprolol (LOPRESSOR) 50 MG tablet 270 tablet 3    Sig: Take one and one-half tablet twice a day.    Authorizing Provider: Antonieta Iba    Ordering User: Kendrick Fries

## 2013-07-27 ENCOUNTER — Ambulatory Visit: Payer: Medicare Other

## 2013-08-09 ENCOUNTER — Ambulatory Visit: Payer: Self-pay | Admitting: Podiatry

## 2013-08-14 ENCOUNTER — Ambulatory Visit (INDEPENDENT_AMBULATORY_CARE_PROVIDER_SITE_OTHER): Payer: Medicare Other | Admitting: Podiatry

## 2013-08-14 ENCOUNTER — Encounter: Payer: Self-pay | Admitting: Podiatry

## 2013-08-14 VITALS — Resp 16 | Ht 64.0 in | Wt 130.0 lb

## 2013-08-14 DIAGNOSIS — B351 Tinea unguium: Secondary | ICD-10-CM

## 2013-08-14 DIAGNOSIS — M79609 Pain in unspecified limb: Secondary | ICD-10-CM

## 2013-08-14 NOTE — Progress Notes (Signed)
Rebecca Jennings presents today in her stocking feet with wet socks and no shoes. The temperature is less than 40 outside and raining. She's complaining of painful elongated toenails bilateral.  Objective: Vital signs are stable she is alert and oriented x3 and states that her feet were too sore one for her to wear shoes. Nails are thick yellow dystrophic clinically mycotic and painful palpation as well as debridement.  Assessment: Pain in limb secondary to onychomycosis 1 through 5 bilateral. Edema bilateral.  Plan: Debridement nails thickness and length as cover service secondary to pain.

## 2013-08-21 ENCOUNTER — Ambulatory Visit: Payer: Medicare Other | Admitting: Cardiovascular Disease

## 2013-08-23 ENCOUNTER — Ambulatory Visit: Payer: Medicare Other | Admitting: Cardiovascular Disease

## 2013-08-29 ENCOUNTER — Other Ambulatory Visit: Payer: Self-pay | Admitting: Cardiovascular Disease

## 2013-08-29 ENCOUNTER — Ambulatory Visit (INDEPENDENT_AMBULATORY_CARE_PROVIDER_SITE_OTHER): Payer: Medicare Other | Admitting: Cardiovascular Disease

## 2013-08-29 ENCOUNTER — Encounter: Payer: Self-pay | Admitting: Cardiovascular Disease

## 2013-08-29 VITALS — BP 125/85 | HR 112 | Ht 64.0 in | Wt 130.0 lb

## 2013-08-29 DIAGNOSIS — R609 Edema, unspecified: Secondary | ICD-10-CM

## 2013-08-29 DIAGNOSIS — I5033 Acute on chronic diastolic (congestive) heart failure: Secondary | ICD-10-CM

## 2013-08-29 DIAGNOSIS — I10 Essential (primary) hypertension: Secondary | ICD-10-CM

## 2013-08-29 DIAGNOSIS — I4891 Unspecified atrial fibrillation: Secondary | ICD-10-CM

## 2013-08-29 DIAGNOSIS — I509 Heart failure, unspecified: Secondary | ICD-10-CM

## 2013-08-29 DIAGNOSIS — R600 Localized edema: Secondary | ICD-10-CM

## 2013-08-29 MED ORDER — FUROSEMIDE 40 MG PO TABS: 40.0000 mg | ORAL_TABLET | Freq: Every day | ORAL | Status: DC

## 2013-08-29 NOTE — Patient Instructions (Addendum)
You have weight gain of 20 pounds This is likely from leg swelling  Please take lasix 40 mg daily until weight has improved back to 115 pounds then down to 20 mg daily Weight today is 130 pounds  We will do blood work today  Please call us if you have new issues that need to be addressed before your next appt.  Your physician wants you to follow-up in: 1 month.

## 2013-08-29 NOTE — Assessment & Plan Note (Signed)
Blood pressure is well controlled on today's visit. No changes made to the medications. 

## 2013-08-29 NOTE — Assessment & Plan Note (Addendum)
We have ordered a home nurse to help with medications. Heart rate continues to run high. Unable to add calcium channel blockers. Concerned about adding digoxin without nursing supervision. Once nursing is in place, could increase Lopressor up to 100 mg twice a day. Need to confirm she is taking 75 mg twice a day at current time. Currently on warfarin

## 2013-08-29 NOTE — Assessment & Plan Note (Addendum)
Severe worsening of her leg edema, 21 pound weight gain since her prior clinic visit. We have ordered a CHF nurse to help us with the medications. We'll change Lasix 20 mg 4 days per week up to Lasix 40 mg daily. Continue HCTZ. I'm concerned about medication and CHF dietary compliance

## 2013-08-29 NOTE — Assessment & Plan Note (Signed)
Severe worsening of her edema, at least 2+ pitting, weeping sores. Heart failure nurse ordered

## 2013-08-29 NOTE — Progress Notes (Signed)
Patient ID: Saintclair Halstedatricia Tesar, female    DOB: 1923-02-15, 80 y.o.   MRN: [id number]  HPI Comments: [name] is a very pleasant 80 year old woman with a history of chronic atrial fibrillation, on Coumadin with rate control, negative stress test in August 2009 with no known coronary artery disease who presents for routine followup,   H/o right mastectomy for breast cancer. Following the surgery, she had atrial fibrillation with RVR. She was given a dose of Cardizem and her rate improved.  On her last clinic visit she had 1-2+ leg edema. In followup today, she reports that she is doing well. No significant shortness of breath. She states that she lives in independent living the details are unavailable. Legs have been weeping, more so worse, more swollen. Weight has increased from 109 pounds on her last clinic visit up to 130 pounds today. She reports that she does not drink very much fluids. She has a list of her Lasix and dosing schedule but is uncertain as to what medication she is taking. She is unable to wear TED hose.  She denies any lightheadedness or dizziness. Son visits and provides assistance   EKG shows atrial fibrillation with ventricular rate 112  beats per minute, nonspecific ST abnormality LVH by voltage criteria   Outpatient Encounter Prescriptions as of 08/29/2013  Medication Sig  . alendronate (FOSAMAX) 70 MG tablet Take 70 mg by mouth every 7 (seven) days. Take with a full glass of water on an empty stomach. As needed.  Marland Kitchen. aspirin 81 MG tablet Take 81 mg by mouth daily.    . bisacodyl (DULCOLAX) 5 MG EC tablet Take 5 mg by mouth daily as needed.  . Calcium Carbonate-Vitamin D (CALTRATE 600+D) 600-400 MG-UNIT per tablet Take 1 tablet by mouth 2 (two) times daily.  Marland Kitchen. ENSURE (ENSURE) Take 237 mLs by mouth daily.  . furosemide (LASIX) 20 MG tablet Take 20 mg by mouth 2 (two) times a week. As needed.  . hydrochlorothiazide (HYDRODIURIL) 25 MG tablet Take 1 tablet (25 mg total)  by mouth daily.  Marland Kitchen. letrozole (FEMARA) 2.5 MG tablet Take 2.5 mg by mouth daily.    . metoprolol (LOPRESSOR) 50 MG tablet Take one and one-half tablet twice a day.  . triamcinolone cream (KENALOG) 0.1 % Apply topically 2 (two) times daily.  Marland Kitchen. warfarin (COUMADIN) 3 MG tablet TAKE 1 TABLET EVERY DAY OR AS DIRECTED BY COUMADIN CLINIC     Review of Systems  Constitutional: Negative.   HENT: Negative.   Eyes: Negative.   Respiratory: Negative.   Cardiovascular: Positive for leg swelling.  Gastrointestinal: Negative.   Endocrine: Negative.   Musculoskeletal: Positive for gait problem.  Skin: Negative.   Allergic/Immunologic: Negative.   Neurological: Negative.   Hematological: Negative.   Psychiatric/Behavioral: Negative.   All other systems reviewed and are negative.    BP 125/85  Pulse 112  Ht 5\' 4"  (1.626 m)  Wt 130 lb (58.968 kg)  BMI 22.30 kg/m2  Physical Exam  Nursing note and vitals reviewed. Constitutional: She is oriented to person, place, and time. She appears well-developed and well-nourished.  HENT:  Head: Normocephalic.  Nose: Nose normal.  Mouth/Throat: Oropharynx is clear and moist.  Eyes: Conjunctivae are normal. Pupils are equal, round, and reactive to light.  Neck: Normal range of motion. Neck supple. No JVD present.  Cardiovascular: Normal rate, S1 normal, S2 normal and intact distal pulses.  An irregularly irregular rhythm present. Exam reveals no gallop and no friction rub.  Murmur heard.  Crescendo systolic murmur is present with a grade of 2/6  2+ pitting edema in the legs, worse than previous visits with sores and areas of weeping  Pulmonary/Chest: Effort normal and breath sounds normal. No respiratory distress. She has no wheezes. She has no rales. She exhibits no tenderness.  Abdominal: Soft. Bowel sounds are normal. She exhibits no distension. There is no tenderness.  Musculoskeletal: Normal range of motion. She exhibits no edema and no tenderness.   Lymphadenopathy:    She has no cervical adenopathy.  Neurological: She is alert and oriented to person, place, and time. Coordination normal.  Skin: Skin is warm and dry. No rash noted. No erythema.  Psychiatric: She has a normal mood and affect. Her behavior is normal. Judgment and thought content normal.    Assessment and Plan

## 2013-08-30 LAB — BMP8+EGFR
BUN / CREAT RATIO: 29 — AB (ref 11–26)
BUN: 28 mg/dL — AB (ref 8–27)
CHLORIDE: 93 mmol/L — AB (ref 97–108)
CO2: 26 mmol/L (ref 18–29)
CREATININE: 0.98 mg/dL (ref 0.57–1.00)
Calcium: 10 mg/dL (ref 8.6–10.2)
GFR calc non Af Amer: 52 mL/min/{1.73_m2} — ABNORMAL LOW (ref >59)
GFR, EST AFRICAN AMERICAN: 60 mL/min/{1.73_m2} (ref >59)
GLUCOSE: 83 mg/dL (ref 65–99)
Potassium: 3.9 mmol/L (ref 3.5–5.2)
Sodium: 140 mmol/L (ref 134–144)

## 2013-09-05 ENCOUNTER — Other Ambulatory Visit: Payer: Self-pay | Admitting: Family Medicine

## 2013-09-05 NOTE — Telephone Encounter (Signed)
Ok to refill 

## 2013-09-06 ENCOUNTER — Telehealth: Payer: Self-pay | Admitting: Family Medicine

## 2013-09-06 NOTE — Telephone Encounter (Signed)
Attempted to call pt to schedule INR check.  Pt did not answer.  Unable to leave message.

## 2013-09-13 ENCOUNTER — Ambulatory Visit: Payer: Self-pay | Admitting: General Surgery

## 2013-09-18 ENCOUNTER — Ambulatory Visit: Payer: Self-pay | Admitting: General Surgery

## 2013-09-21 ENCOUNTER — Encounter: Payer: Self-pay | Admitting: General Surgery

## 2013-09-21 ENCOUNTER — Telehealth: Payer: Self-pay | Admitting: Family Medicine

## 2013-09-21 NOTE — Telephone Encounter (Signed)
I do not think I have ever seen pt.  Is there something I can do to help?  Do we need to ask Morrie Sheldonshley to go see pt at home to check on her?

## 2013-09-21 NOTE — Telephone Encounter (Signed)
Attempted to call pt for second time to schedule INR.  She has not been seen since 07/2013.  She said that she is having a hard time getting here so I asked her if she would be willing to have INR drawn at [facility]  like some of our other patients do.  She asked when they draw labs and I told her I would talk to Morrie SheldonAshley, Charity fundraiserN at [facility] and get back to her.  I spoke to [place]ley, Charity fundraiserN and she said that in the past she and Dr. Sharen HonesGutierrez had tried to get pt to have INR drawn at [facility] before but pt would not agree.  I spoke to pt and said that 7:30 was too early.  I scheduled her to come back to our office to have it checked on 09/28/13.  I spent a lot of time going over the same information with pt and she seemed to be confused.  She kept repeating that she thought I was calling from Dr. Diego CoryBurnette's office and repeating different dates besides the date I was telling her to come in for her appt.  At the end of our conversation, she repeated the correct date and time and location for her appt.

## 2013-09-22 ENCOUNTER — Ambulatory Visit: Payer: Medicare Other | Admitting: Cardiovascular Disease

## 2013-09-22 NOTE — Telephone Encounter (Signed)
I've rescheduled her for next week so I'll see if she's able to get here and go from there.

## 2013-09-25 ENCOUNTER — Ambulatory Visit: Payer: Medicare Other

## 2013-09-26 ENCOUNTER — Encounter: Payer: Self-pay | Admitting: Cardiovascular Disease

## 2013-09-26 ENCOUNTER — Ambulatory Visit (INDEPENDENT_AMBULATORY_CARE_PROVIDER_SITE_OTHER): Payer: Medicare Other | Admitting: Cardiovascular Disease

## 2013-09-26 VITALS — BP 98/64 | HR 94 | Ht 64.0 in | Wt 101.2 lb

## 2013-09-26 DIAGNOSIS — R413 Other amnesia: Secondary | ICD-10-CM

## 2013-09-26 DIAGNOSIS — I4891 Unspecified atrial fibrillation: Secondary | ICD-10-CM

## 2013-09-26 DIAGNOSIS — I5032 Chronic diastolic (congestive) heart failure: Secondary | ICD-10-CM

## 2013-09-26 DIAGNOSIS — I5033 Acute on chronic diastolic (congestive) heart failure: Secondary | ICD-10-CM

## 2013-09-26 DIAGNOSIS — R600 Localized edema: Secondary | ICD-10-CM

## 2013-09-26 DIAGNOSIS — I509 Heart failure, unspecified: Secondary | ICD-10-CM

## 2013-09-26 DIAGNOSIS — R609 Edema, unspecified: Secondary | ICD-10-CM

## 2013-09-26 DIAGNOSIS — I10 Essential (primary) hypertension: Secondary | ICD-10-CM

## 2013-09-26 NOTE — Assessment & Plan Note (Signed)
Continue Lasix 40 mg daily . Will monitor closely with followup in one month. Edema significantly improved

## 2013-09-26 NOTE — Assessment & Plan Note (Signed)
Heart rate reasonable. Continue current medications

## 2013-09-26 NOTE — Assessment & Plan Note (Signed)
Low blood pressure. We'll continue current meds for now. Asymptomatic currently

## 2013-09-26 NOTE — Progress Notes (Signed)
Patient ID: Rebecca Jennings, female    DOB: 1923/06/11, 80 y.o.   MRN: [id number]  HPI Comments: And willMs. Jennings is a very pleasant 80 year old woman with a history of chronic atrial fibrillation, on Coumadin with rate control, negative stress test in August 2009 with no known coronary artery disease who presents for routine followup,   H/o right mastectomy for breast cancer. Following the surgery, she had atrial fibrillation with RVR. She was given a dose of Cardizem and her rate improved.  Last clinic visit, she had severe lower extremity edema with sores, weeping down her legs. We suggested that she take Lasix 40 mg daily. On this regimen, weight has significantly improved, leg edema also improved. Sores have started to heal. She reports that she does have continued edema but much better We confirmed her Lasix dosing with [facility] She is unable to wear TED hose.  She denies any lightheadedness or dizziness. Son visits and provides assistance   EKG shows atrial fibrillation with ventricular rate 94  beats per minute, nonspecific ST abnormality LVH by voltage criteria   Outpatient Encounter Prescriptions as of 09/26/2013  Medication Sig  . aspirin 81 MG tablet Take 81 mg by mouth daily.    . bisacodyl (DULCOLAX) 5 MG EC tablet Take 5 mg by mouth daily as needed.  . Calcium Carbonate-Vitamin D (CALTRATE 600+D) 600-400 MG-UNIT per tablet Take 1 tablet by mouth 2 (two) times daily.  Marland Kitchen ENSURE (ENSURE) Take 237 mLs by mouth daily.  . furosemide (LASIX) 40 MG tablet Take 1 tablet (40 mg total) by mouth daily.  . hydrochlorothiazide (HYDRODIURIL) 25 MG tablet Take 1 tablet (25 mg total) by mouth daily.  Marland Kitchen letrozole (FEMARA) 2.5 MG tablet Take 2.5 mg by mouth daily.    . metoprolol (LOPRESSOR) 50 MG tablet Take one and one-half tablet twice a day.  . triamcinolone cream (KENALOG) 0.1 % Apply topically 2 (two) times daily.  Marland Kitchen warfarin (COUMADIN) 3 MG tablet TAKE 1 TABLET EVERY DAY  OR USE AS DIRECTED BY COUMADIN CLINIC  . [DISCONTINUED] alendronate (FOSAMAX) 70 MG tablet Take 70 mg by mouth every 7 (seven) days. Take with a full glass of water on an empty stomach. As needed.     Review of Systems  Constitutional: Positive for unexpected weight change.  HENT: Negative.   Eyes: Negative.   Respiratory: Negative.   Cardiovascular: Positive for leg swelling.  Gastrointestinal: Negative.   Endocrine: Negative.   Musculoskeletal: Positive for gait problem.  Skin: Negative.   Allergic/Immunologic: Negative.   Neurological: Negative.   Hematological: Negative.   Psychiatric/Behavioral: Negative.   All other systems reviewed and are negative.    BP 98/64  Pulse 94  Ht 5\' 4"  (1.626 m)  Wt 101 lb 4 oz (45.927 kg)  BMI 17.37 kg/m2  Physical Exam  Nursing note and vitals reviewed. Constitutional: She is oriented to person, place, and time. She appears well-developed and well-nourished.  HENT:  Head: Normocephalic.  Nose: Nose normal.  Mouth/Throat: Oropharynx is clear and moist.  Eyes: Conjunctivae are normal. Pupils are equal, round, and reactive to light.  Neck: Normal range of motion. Neck supple. No JVD present.  Cardiovascular: Normal rate, S1 normal, S2 normal and intact distal pulses.  An irregularly irregular rhythm present. Exam reveals no gallop and no friction rub.   Murmur heard.  Crescendo systolic murmur is present with a grade of 2/6  1+ pitting edema in the legs  Pulmonary/Chest: Effort normal and breath sounds  normal. No respiratory distress. She has no wheezes. She has no rales. She exhibits no tenderness.  Abdominal: Soft. Bowel sounds are normal. She exhibits no distension. There is no tenderness.  Musculoskeletal: Normal range of motion. She exhibits no edema and no tenderness.  Lymphadenopathy:    She has no cervical adenopathy.  Neurological: She is alert and oriented to person, place, and time. Coordination normal.  Skin: Skin is warm  and dry. No rash noted. No erythema.  Psychiatric: She has a normal mood and affect. Her behavior is normal. Judgment and thought content normal.    Assessment and Plan

## 2013-09-26 NOTE — Assessment & Plan Note (Signed)
On her previous visits seem to have medication confusion. Better today. Nurse was ordered previously for assistance with medications. She reports that no one has come to the house. "I do not want a nurse"

## 2013-09-26 NOTE — Assessment & Plan Note (Signed)
Significantly improved on aggressive diuresis. We'll need to monitor for overdiuresis in the next visit. We'll check basic metabolic panel today

## 2013-09-26 NOTE — Patient Instructions (Signed)
You are doing well. No medication changes were made. Continue your currents meds  Please call us if you have new issues that need to be addressed before your next appt.  Your physician wants you to follow-up in: 1 month.

## 2013-09-28 ENCOUNTER — Ambulatory Visit (INDEPENDENT_AMBULATORY_CARE_PROVIDER_SITE_OTHER): Payer: Medicare Other | Admitting: Family Medicine

## 2013-09-28 DIAGNOSIS — I4891 Unspecified atrial fibrillation: Secondary | ICD-10-CM

## 2013-09-28 DIAGNOSIS — Z5181 Encounter for therapeutic drug level monitoring: Secondary | ICD-10-CM

## 2013-09-28 DIAGNOSIS — Z7901 Long term (current) use of anticoagulants: Secondary | ICD-10-CM

## 2013-09-28 LAB — POCT INR: INR: 4.6

## 2013-10-04 ENCOUNTER — Ambulatory Visit: Payer: Medicare Other | Admitting: Cardiovascular Disease

## 2013-10-04 ENCOUNTER — Ambulatory Visit (INDEPENDENT_AMBULATORY_CARE_PROVIDER_SITE_OTHER): Payer: Medicare Other | Admitting: Physician Assistant

## 2013-10-04 ENCOUNTER — Encounter: Payer: Self-pay | Admitting: Physician Assistant

## 2013-10-04 VITALS — BP 100/82 | HR 88 | Ht 64.0 in | Wt 99.5 lb

## 2013-10-04 DIAGNOSIS — R234 Changes in skin texture: Secondary | ICD-10-CM | POA: Insufficient documentation

## 2013-10-04 DIAGNOSIS — L988 Other specified disorders of the skin and subcutaneous tissue: Secondary | ICD-10-CM

## 2013-10-04 DIAGNOSIS — R413 Other amnesia: Secondary | ICD-10-CM

## 2013-10-04 NOTE — Patient Instructions (Signed)
Please use Vaseline or a thick moisturizing cream or chapstick on your fingers. Please try an over the counter antibiotic ointment.  Please keep out of the cold or wear gloves when outside. Limit hand washing to when using the bathroom and before meals. Would hold off on washing hands regularly, or be sure to moisturize after each hand washing.  Please start a multivitamin and continue ensure supplements.   We will refer you to primary care for further recommendations regarding your fingers.

## 2013-10-04 NOTE — Assessment & Plan Note (Signed)
The patient is a bit confused today. Reassured that her walker is in her possession. She does live at home and refuses home health services. Concerned about ability to continue to care for herself. Advised to follow-up with PCP, and address memory deficits, safety and caring for herself.

## 2013-10-04 NOTE — Assessment & Plan Note (Signed)
Suspect dry hands, frequent hand washing, possible nutritional deficiency attributing to fissures in her fingers. Will refer back to PCP. Recommended OTC thick moisturizers, OTC topical antibiotics, minimize hand washing, wear gloves outside.

## 2013-10-04 NOTE — Progress Notes (Signed)
**Note Rebecca-Identified via Obfuscation** Patient ID: Rebecca Jennings, female   DOB: 08/09/1923, 80 y.o.   MRN: [id number]   Date:  10/04/2013   ID:  Rebecca Jennings, DOB 04/07/1923, MRN [id number]  PCP:  Arnette Norris, MD  Primary Cardiologist:  Johnny Bridge, MD   History of Present Illness:  Rebecca Jennings is a 80 y.o. female w/ PMHx s/f dementia/memory deficits, permanent atrial fibrillation, chronic Coumadin anticoagulation, chronic diastolic CHF, HTN and h/o breast CA s/p R mastectomy who presents today for follow-up.   She previously had an appointment scheduled with Dr. Rockey Situ for today; however, this was moved to 2/3. She wanted to be seen regarding finger sores. She is a bit confused today, thinking that someone may have taken her walker.  She reports improved LE edema. Atrial fibrillation well-controlled. Her main complaint today is that she has developed deep fissures in her R fingers. She does not work outside often. She does wash her hands frequently. The fissures have continued to expand and are tender to the touch. No fevers or chills. She admits to eating a limited diet.    Wt Readings from Last 3 Encounters:  10/04/13 99 lb 8 oz (45.133 kg)  09/26/13 101 lb 4 oz (45.927 kg)  08/29/13 130 lb (58.968 kg)     Past Medical History  Diagnosis Date  . Atrial fibrillation     ([place])  . Osteoporosis     DEXA 08/2011 T -3.3  . HTN (hypertension)   . History of breast cancer 2010    R adenocarcinoma s/p lumpectomy with axillary dissection and XRT, stage [id number] grade 2, ER+(90%),PR-,HER2- ([place])  . Stasis dermatitis     Current Outpatient Prescriptions  Medication Sig Dispense Refill  . aspirin 81 MG tablet Take 81 mg by mouth daily.        . bisacodyl (DULCOLAX) 5 MG EC tablet Take 5 mg by mouth daily as needed.      . Calcium Carbonate-Vitamin D (CALTRATE 600+D) 600-400 MG-UNIT per tablet Take 1 tablet by mouth 2 (two) times daily.      Marland Kitchen ENSURE (ENSURE) Take 237 mLs by mouth daily.      . furosemide  (LASIX) 40 MG tablet Take 40 mg by mouth daily.      . hydrochlorothiazide (HYDRODIURIL) 25 MG tablet Take 1 tablet (25 mg total) by mouth daily.  30 tablet  3  . letrozole (FEMARA) 2.5 MG tablet Take 2.5 mg by mouth daily.        . metoprolol (LOPRESSOR) 50 MG tablet Take one and one-half tablet twice a day.  270 tablet  3  . triamcinolone cream (KENALOG) 0.1 % Apply topically 2 (two) times daily.      Marland Kitchen warfarin (COUMADIN) 3 MG tablet TAKE 1 TABLET EVERY DAY OR USE AS DIRECTED BY COUMADIN CLINIC  30 tablet  2   No current facility-administered medications for this visit.    Allergies:   No Known Allergies  Social History:  The patient  reports that she has never smoked. She has never used smokeless tobacco. She reports that she does not drink alcohol or use illicit drugs.   Family History:  Family History  Problem Relation Age of Onset  . Sudden death Father 13  . Cancer Neg Hx   . Diabetes Neg Hx   . CAD Neg Hx   . Stroke Neg Hx     Review of Systems: General:  negative for chills, fever, night sweats or weight changes.  Cardiovascular:  Positive for  edema, negative for chest pain, dyspnea on exertion, orthopnea, palpitations, paroxysmal nocturnal dyspnea or shortness of breath Dermatological:  Bilateral leg rash Respiratory: negative for cough or wheezing Urologic:  negative for hematuria Abdominal: negative for nausea, vomiting, diarrhea, bright red blood per rectum, melena, or hematemesis Neurologic: negative for visual changes, syncope, or dizziness All other systems reviewed and are otherwise negative except as noted above.  PHYSICAL EXAM: VS:  BP 100/82  Pulse 88  Ht 5' 4"  (1.626 m)  Wt 99 lb 8 oz (45.133 kg)  BMI 17.07 kg/m2 Well nourished, well developed, in no acute distress HEENT: normal, PERRL Neck: no JVD or bruits Cardiac:  normal S1, S2; irregularly irregular; no murmur or gallops Lungs:  clear to auscultation bilaterally, no wheezing, rhonchi or  rales Abd: soft, nontender, no hepatomegaly, normoactive BS x 4 quads Ext: linear fissures on the tips of the R 2nd-4th digits, bilateral 1-2+ pitting pretibial edema, hyperpigmentation, no cyanosis or clubbing Skin: warm and dry, cap refill < 2 sec Neuro:  CNs 2-12 intact, no focal abnormalities noted Musculoskeletal: strength and tone appropriate for age, thoracic kyphosis Psych: normal affect

## 2013-10-09 ENCOUNTER — Ambulatory Visit: Payer: Medicare Other | Admitting: Family Medicine

## 2013-10-09 DIAGNOSIS — Z0289 Encounter for other administrative examinations: Secondary | ICD-10-CM

## 2013-10-10 ENCOUNTER — Ambulatory Visit: Payer: Self-pay | Admitting: General Surgery

## 2013-10-19 ENCOUNTER — Ambulatory Visit: Payer: Medicare Other

## 2013-10-23 ENCOUNTER — Ambulatory Visit: Payer: Medicare Other | Admitting: Family Medicine

## 2013-10-23 DIAGNOSIS — Z0289 Encounter for other administrative examinations: Secondary | ICD-10-CM

## 2013-10-25 ENCOUNTER — Encounter: Payer: Self-pay | Admitting: Cardiovascular Disease

## 2013-10-25 ENCOUNTER — Ambulatory Visit (INDEPENDENT_AMBULATORY_CARE_PROVIDER_SITE_OTHER): Payer: Medicare Other | Admitting: Cardiovascular Disease

## 2013-10-25 VITALS — BP 98/64 | HR 113 | Ht 64.0 in | Wt 97.5 lb

## 2013-10-25 DIAGNOSIS — R600 Localized edema: Secondary | ICD-10-CM

## 2013-10-25 DIAGNOSIS — Z7901 Long term (current) use of anticoagulants: Secondary | ICD-10-CM

## 2013-10-25 DIAGNOSIS — I509 Heart failure, unspecified: Secondary | ICD-10-CM

## 2013-10-25 DIAGNOSIS — I10 Essential (primary) hypertension: Secondary | ICD-10-CM

## 2013-10-25 DIAGNOSIS — R609 Edema, unspecified: Secondary | ICD-10-CM

## 2013-10-25 DIAGNOSIS — I5033 Acute on chronic diastolic (congestive) heart failure: Secondary | ICD-10-CM

## 2013-10-25 DIAGNOSIS — I4891 Unspecified atrial fibrillation: Secondary | ICD-10-CM

## 2013-10-25 MED ORDER — DIGOXIN 125 MCG PO TABS: 0.1250 mg | ORAL_TABLET | Freq: Every day | ORAL | Status: AC

## 2013-10-25 NOTE — Patient Instructions (Addendum)
Please start digoxin one a day Please take lasix/furosemide one a day for leg weeping  We will schedule an echocardiogram for your atrial fibrillation and leg edema  Please call us if you have new issues that need to be addressed before your next appt.  Your physician wants you to follow-up in: 1 month.

## 2013-10-25 NOTE — Assessment & Plan Note (Signed)
Blood pressure running low likely from her blood pressure medications and very low body weight. If she has any near syncope or dizzy spells, may need to hold the HCTZ

## 2013-10-25 NOTE — Progress Notes (Signed)
Patient ID: Rebecca Jennings, female    DOB: 1922-12-07, 80 y.o.   MRN: [id number]  HPI Comments: Rebecca Jennings is a very pleasant 80 year old woman with a history of chronic atrial fibrillation, on Coumadin with rate control, negative stress test in August 2009 with no known coronary artery disease who presents for routine followup,   H/o right mastectomy for breast cancer. Following the surgery, she had atrial fibrillation with RVR. She was given a dose of Cardizem and her rate improved.  Over the last several clinic visits, she has had severe lower extremity edema with sores, weeping down her legs. We suggested that she take Lasix 40 mg daily. On this regimen, weight has significantly improved, leg edema also improved. Sores  started to heal.  She is unable to wear TED hose.  She denies any lightheadedness or dizziness. Son visits and provides assistance  In followup today, she has not been taking Lasix daily, possibly as needed when her sores start to weep. Weight is relatively unchanged. She denies any shortness of breath or chest pain. Her gait is very unstable, she walks with a walker. She does not go outside very much    EKG shows atrial fibrillation with ventricular rate 113  beats per minute, nonspecific ST abnormality    Outpatient Encounter Prescriptions as of 10/25/2013  Medication Sig  . aspirin 81 MG tablet Take 81 mg by mouth daily.    . bisacodyl (DULCOLAX) 5 MG EC tablet Take 5 mg by mouth daily as needed.  . Calcium Carbonate-Vitamin D (CALTRATE 600+D) 600-400 MG-UNIT per tablet Take 1 tablet by mouth 2 (two) times daily.  Marland Kitchen. ENSURE (ENSURE) Take 237 mLs by mouth daily.  . furosemide (LASIX) 40 MG tablet Take 40 mg by mouth daily.  . hydrochlorothiazide (HYDRODIURIL) 25 MG tablet Take 1 tablet (25 mg total) by mouth daily.  Marland Kitchen. letrozole (FEMARA) 2.5 MG tablet Take 2.5 mg by mouth daily.    . metoprolol (LOPRESSOR) 50 MG tablet Take one and one-half tablet twice a day.  .  triamcinolone cream (KENALOG) 0.1 % Apply topically 2 (two) times daily.  Marland Kitchen. warfarin (COUMADIN) 3 MG tablet TAKE 1 TABLET EVERY DAY OR USE AS DIRECTED BY COUMADIN CLINIC     Review of Systems  HENT: Negative.   Eyes: Negative.   Respiratory: Negative.   Cardiovascular: Positive for leg swelling.       Weeping leg sores  Gastrointestinal: Negative.   Endocrine: Negative.   Musculoskeletal: Positive for gait problem.  Skin: Negative.   Allergic/Immunologic: Negative.   Neurological: Negative.   Hematological: Negative.   Psychiatric/Behavioral: Negative.   All other systems reviewed and are negative.    BP 98/64  Pulse 113  Ht 5\' 4"  (1.626 m)  Wt 97 lb 8 oz (44.226 kg)  BMI 16.73 kg/m2  Physical Exam  Nursing note and vitals reviewed. Constitutional: She is oriented to person, place, and time. She appears well-developed and well-nourished.  HENT:  Head: Normocephalic.  Nose: Nose normal.  Mouth/Throat: Oropharynx is clear and moist.  Eyes: Conjunctivae are normal. Pupils are equal, round, and reactive to light.  Neck: Normal range of motion. Neck supple. No JVD present.  Cardiovascular: Normal rate, S1 normal, S2 normal and intact distal pulses.  An irregularly irregular rhythm present. Exam reveals no gallop and no friction rub.   Murmur heard.  Crescendo systolic murmur is present with a grade of 2/6  1+ pitting edema in the legs  Pulmonary/Chest: Effort normal and  breath sounds normal. No respiratory distress. She has no wheezes. She has no rales. She exhibits no tenderness.  Abdominal: Soft. Bowel sounds are normal. She exhibits no distension. There is no tenderness.  Musculoskeletal: Normal range of motion. She exhibits no edema and no tenderness.  Lymphadenopathy:    She has no cervical adenopathy.  Neurological: She is alert and oriented to person, place, and time. Coordination normal.  Skin: Skin is warm and dry. No rash noted. No erythema.  Psychiatric: She has  a normal mood and affect. Her behavior is normal. Judgment and thought content normal.    Assessment and Plan

## 2013-10-25 NOTE — Assessment & Plan Note (Signed)
She appears to be tolerating warfarin. No recent bleeding episodes. No recent falls

## 2013-10-25 NOTE — Assessment & Plan Note (Addendum)
We have suggested she continue on Lasix 40 mg daily in addition to her HCTZ. She has been taking Lasix only periodically. Leg sores are weeping more than her prior clinic visit. Echocardiogram ordered for atrial fibrillation, leg edema, evaluation of ejection fraction

## 2013-10-25 NOTE — Assessment & Plan Note (Signed)
Severe woody edema with sores that are weeping. Encouraged her to take Lasix daily with her HCTZ

## 2013-10-25 NOTE — Assessment & Plan Note (Signed)
Heart rate is elevated today. We will continue her beta blocker, add low-dose digoxin

## 2013-11-03 ENCOUNTER — Other Ambulatory Visit: Payer: Self-pay

## 2013-11-03 ENCOUNTER — Other Ambulatory Visit (INDEPENDENT_AMBULATORY_CARE_PROVIDER_SITE_OTHER): Payer: Medicare Other

## 2013-11-03 DIAGNOSIS — I509 Heart failure, unspecified: Secondary | ICD-10-CM

## 2013-11-03 DIAGNOSIS — R600 Localized edema: Secondary | ICD-10-CM

## 2013-11-03 DIAGNOSIS — I4891 Unspecified atrial fibrillation: Secondary | ICD-10-CM

## 2013-11-03 DIAGNOSIS — R011 Cardiac murmur, unspecified: Secondary | ICD-10-CM

## 2013-11-07 ENCOUNTER — Ambulatory Visit: Payer: Self-pay | Admitting: General Surgery

## 2013-11-13 ENCOUNTER — Ambulatory Visit: Payer: Medicare Other | Admitting: Podiatry

## 2013-11-15 ENCOUNTER — Other Ambulatory Visit: Payer: Self-pay | Admitting: Cardiovascular Disease

## 2013-11-16 ENCOUNTER — Telehealth: Payer: Self-pay | Admitting: *Deleted

## 2013-11-16 NOTE — Telephone Encounter (Signed)
Form signed and in my box. 

## 2013-11-16 NOTE — Telephone Encounter (Signed)
Received a fax from [facility] re DNA testing for pt, since she is on warfarin. The testing is free to the pt, and is not billed to medicare. Fax is in your inbox.

## 2013-11-19 ENCOUNTER — Other Ambulatory Visit: Payer: Self-pay | Admitting: Emergency Medicine

## 2013-11-19 ENCOUNTER — Other Ambulatory Visit: Payer: Self-pay

## 2013-11-19 DIAGNOSIS — I4891 Unspecified atrial fibrillation: Secondary | ICD-10-CM

## 2013-11-20 DIAGNOSIS — I4891 Unspecified atrial fibrillation: Secondary | ICD-10-CM

## 2013-11-22 ENCOUNTER — Encounter: Payer: Self-pay | Admitting: *Deleted

## 2013-11-22 DEATH — deceased

## 2013-11-30 ENCOUNTER — Ambulatory Visit: Payer: Medicare Other | Admitting: Cardiovascular Disease

## 2014-12-24 IMAGING — CT CT CERVICAL SPINE WITHOUT CONTRAST
3 of 4 series · 14 of 33 positions shown, 17 images · non-contrast
Comparison: None.

CLINICAL DATA: Found on floor.  Confusion.

EXAM:
CT CERVICAL SPINE WITHOUT CONTRAST
TECHNIQUE: Multidetector CT imaging of the cervical spine was performed without
intravenous contrast. Multiplanar CT image reconstructions were also
generated.

[Series 4: sag bone · sagittal · 0.27mm/px · 5 of 76 slices shown, 6 images]
[im 26/76  bone]
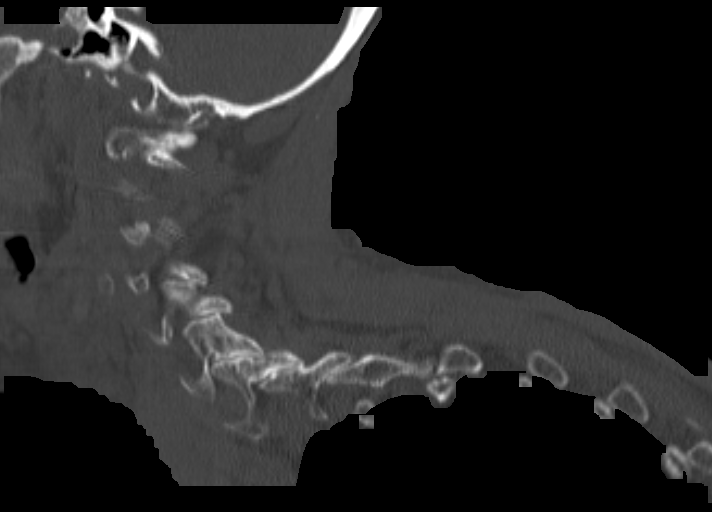
[im 32/76  bone]
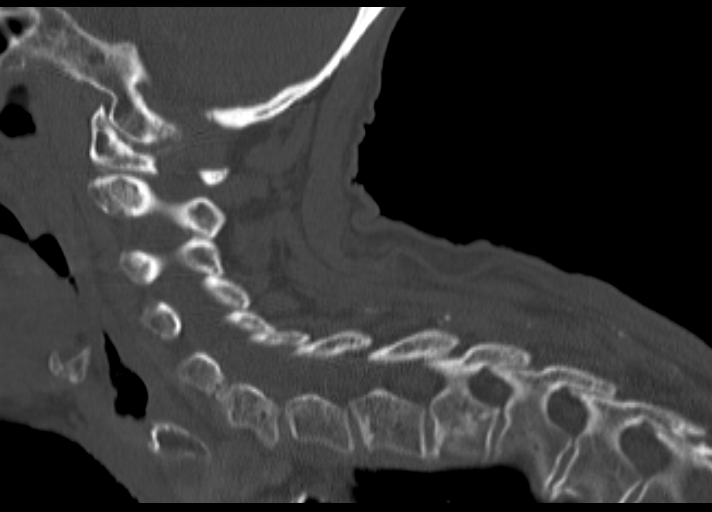
[im 38/76  soft-tissue]
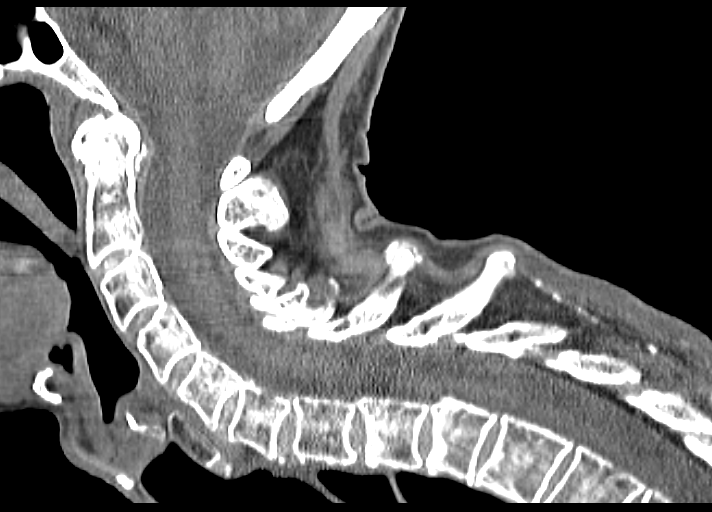
[im 38/76  bone]
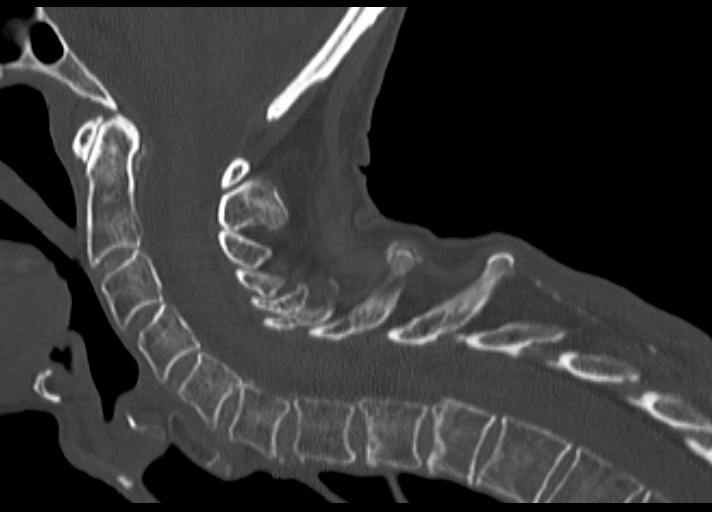
[im 44/76  bone]
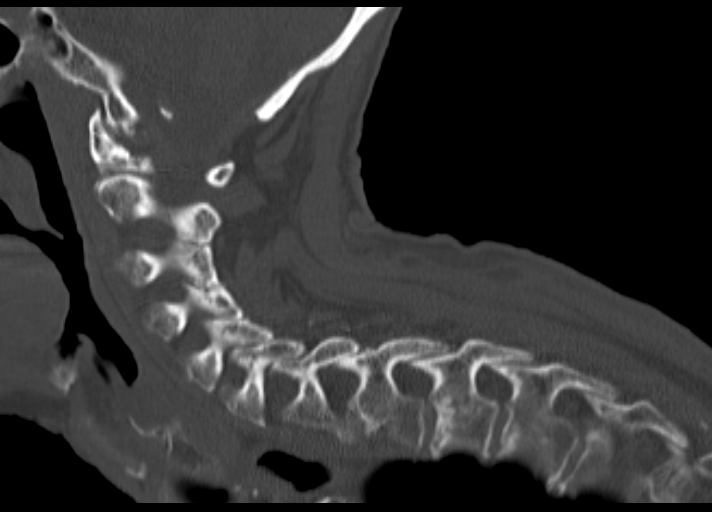
[im 51/76  bone]
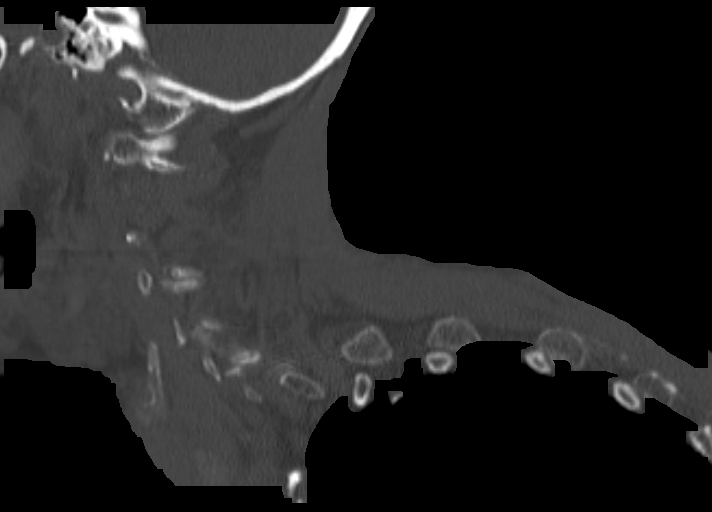

[Series 5: cor bone · coronal · 0.38mm/px · 3 of 85 slices shown]
[im 28/85  bone]
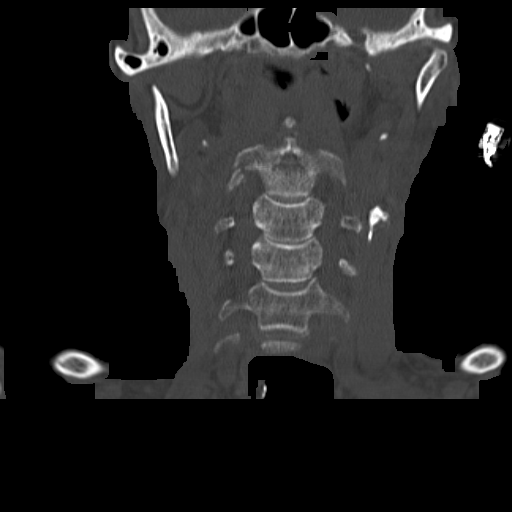
[im 38/85  bone]
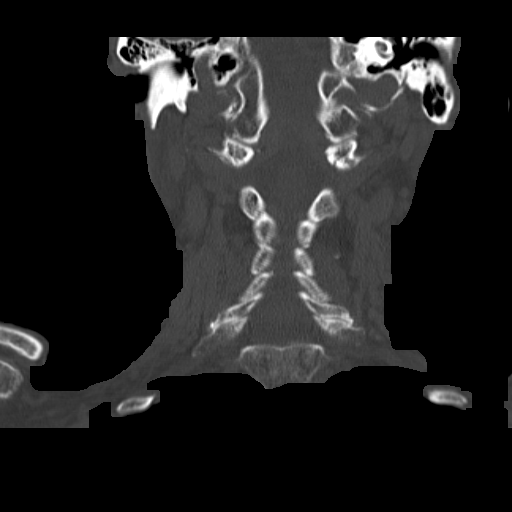
[im 48/85  bone]
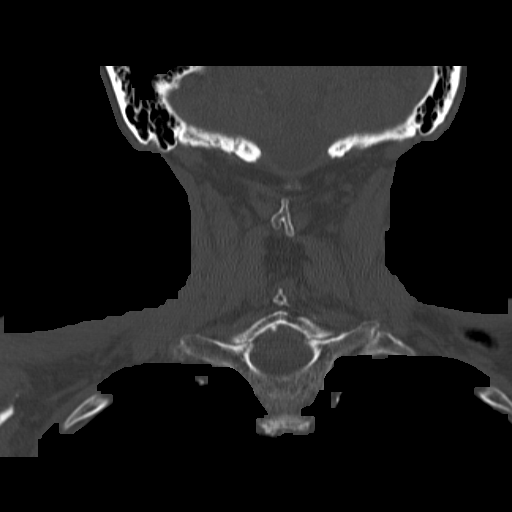

[Series 6: orthogonal axials · axial · 0.29mm/px · z∈[-311,-195]mm · 6 of 92 slices shown, 8 images]
[im 14/92  soft-tissue]
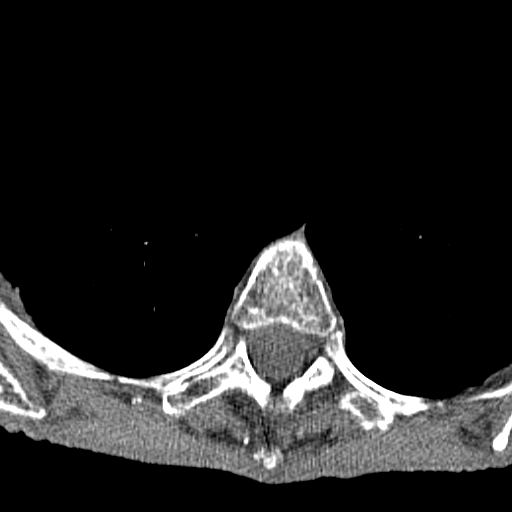
[im 14/92  bone]
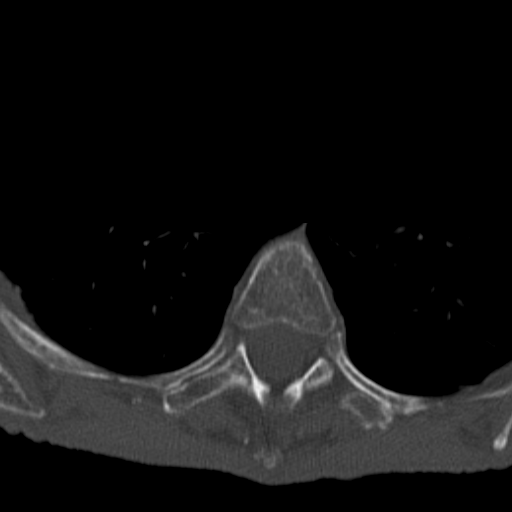
[im 27/92  bone]
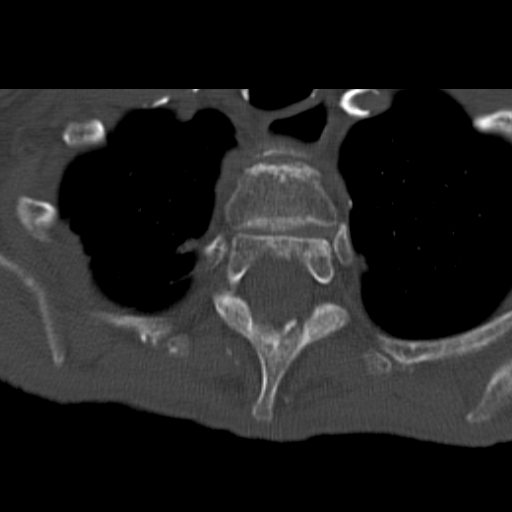
[im 40/92  bone]
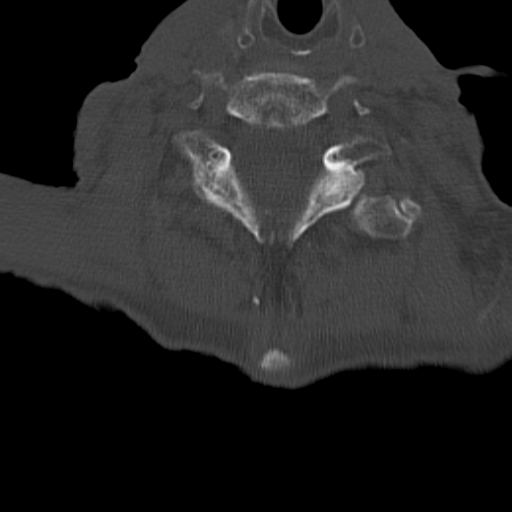
[im 53/92  bone]
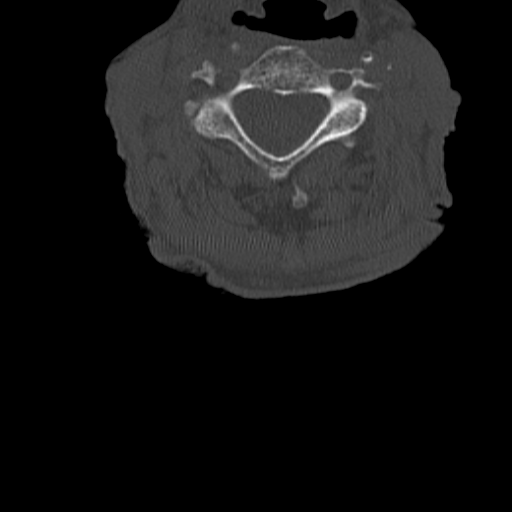
[im 66/92  soft-tissue]
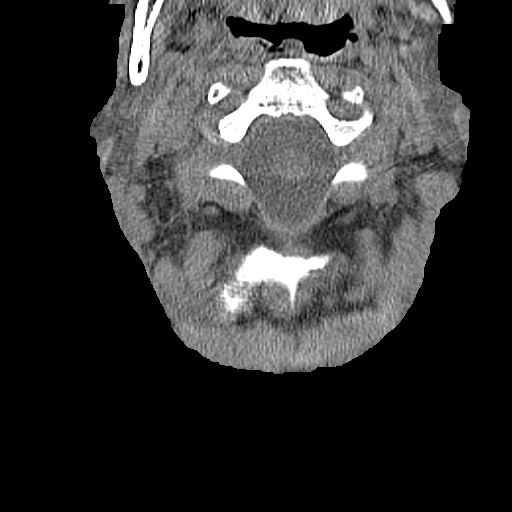
[im 66/92  bone]
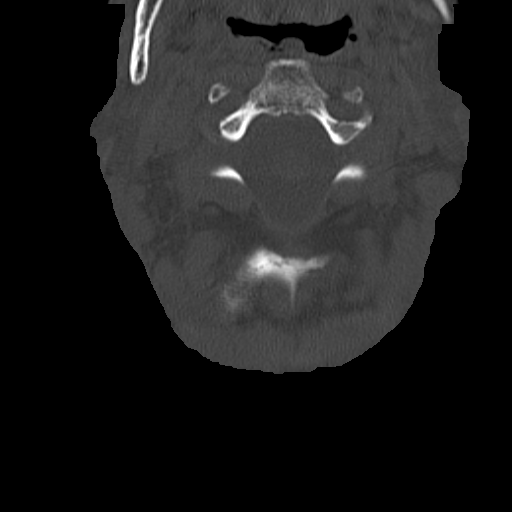
[im 79/92  bone]
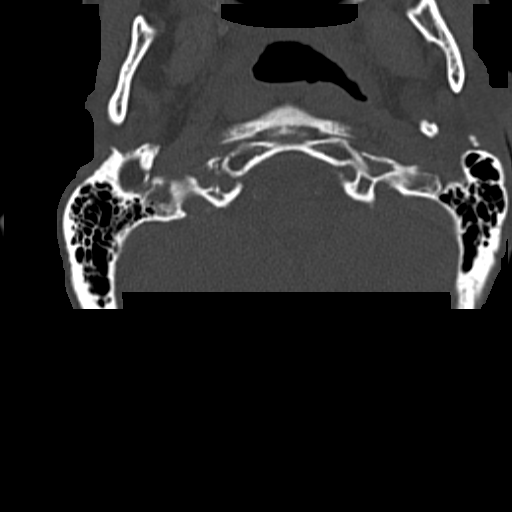

[14 of 33 positions shown; findings below may reference images not displayed]

FINDINGS: Normal alignment. Prevertebral soft tissues are normal. No fracture.
No epidural or paraspinal hematoma.
IMPRESSION: No acute bony abnormality.
